# Patient Record
Sex: Female | Born: 1950 | Race: White | Hispanic: No | Smoking: Current every day smoker
Health system: Southern US, Community
[De-identification: ages and names within clinical notes are randomized; demographics above are authoritative.]

## PROBLEM LIST (undated history)

## (undated) DIAGNOSIS — F419 Anxiety disorder, unspecified: Secondary | ICD-10-CM

## (undated) DIAGNOSIS — J45909 Unspecified asthma, uncomplicated: Secondary | ICD-10-CM

## (undated) DIAGNOSIS — T7840XA Allergy, unspecified, initial encounter: Secondary | ICD-10-CM

## (undated) DIAGNOSIS — K219 Gastro-esophageal reflux disease without esophagitis: Secondary | ICD-10-CM

## (undated) DIAGNOSIS — J449 Chronic obstructive pulmonary disease, unspecified: Secondary | ICD-10-CM

## (undated) DIAGNOSIS — I10 Essential (primary) hypertension: Secondary | ICD-10-CM

## (undated) DIAGNOSIS — F32A Depression, unspecified: Secondary | ICD-10-CM

## (undated) DIAGNOSIS — F329 Major depressive disorder, single episode, unspecified: Secondary | ICD-10-CM

## (undated) DIAGNOSIS — M5134 Other intervertebral disc degeneration, thoracic region: Secondary | ICD-10-CM

## (undated) DIAGNOSIS — E785 Hyperlipidemia, unspecified: Secondary | ICD-10-CM

## (undated) DIAGNOSIS — R011 Cardiac murmur, unspecified: Secondary | ICD-10-CM

## (undated) HISTORY — PX: ABDOMINAL HYSTERECTOMY: SHX81

## (undated) HISTORY — DX: Unspecified asthma, uncomplicated: J45.909

## (undated) HISTORY — DX: Gastro-esophageal reflux disease without esophagitis: K21.9

## (undated) HISTORY — DX: Cardiac murmur, unspecified: R01.1

## (undated) HISTORY — DX: Hyperlipidemia, unspecified: E78.5

## (undated) HISTORY — DX: Essential (primary) hypertension: I10

## (undated) HISTORY — DX: Allergy, unspecified, initial encounter: T78.40XA

## (undated) HISTORY — DX: Depression, unspecified: F32.A

## (undated) HISTORY — PX: APPENDECTOMY: SHX54

## (undated) HISTORY — DX: Major depressive disorder, single episode, unspecified: F32.9

## (undated) HISTORY — DX: Other intervertebral disc degeneration, thoracic region: M51.34

## (undated) HISTORY — DX: Anxiety disorder, unspecified: F41.9

## (undated) HISTORY — PX: VASCULAR SURGERY: SHX849

---

## 2014-02-07 HISTORY — PX: FACIAL COSMETIC SURGERY: SHX629

## 2014-02-07 HISTORY — PX: OTHER SURGICAL HISTORY: SHX169

## 2014-02-07 HISTORY — PX: LAPAROSCOPY ABDOMEN DIAGNOSTIC: PRO50

## 2016-08-15 DIAGNOSIS — I2510 Atherosclerotic heart disease of native coronary artery without angina pectoris: Secondary | ICD-10-CM | POA: Diagnosis not present

## 2016-08-15 DIAGNOSIS — E785 Hyperlipidemia, unspecified: Secondary | ICD-10-CM | POA: Diagnosis not present

## 2016-08-15 DIAGNOSIS — I10 Essential (primary) hypertension: Secondary | ICD-10-CM | POA: Diagnosis not present

## 2016-08-15 DIAGNOSIS — G43909 Migraine, unspecified, not intractable, without status migrainosus: Secondary | ICD-10-CM | POA: Diagnosis not present

## 2016-08-22 DIAGNOSIS — I2510 Atherosclerotic heart disease of native coronary artery without angina pectoris: Secondary | ICD-10-CM | POA: Diagnosis not present

## 2016-08-22 DIAGNOSIS — E785 Hyperlipidemia, unspecified: Secondary | ICD-10-CM | POA: Diagnosis not present

## 2016-08-22 DIAGNOSIS — I10 Essential (primary) hypertension: Secondary | ICD-10-CM | POA: Diagnosis not present

## 2016-08-22 DIAGNOSIS — G43119 Migraine with aura, intractable, without status migrainosus: Secondary | ICD-10-CM | POA: Diagnosis not present

## 2016-08-30 DIAGNOSIS — I2510 Atherosclerotic heart disease of native coronary artery without angina pectoris: Secondary | ICD-10-CM | POA: Diagnosis not present

## 2016-08-30 DIAGNOSIS — E785 Hyperlipidemia, unspecified: Secondary | ICD-10-CM | POA: Diagnosis not present

## 2016-08-30 DIAGNOSIS — M169 Osteoarthritis of hip, unspecified: Secondary | ICD-10-CM | POA: Diagnosis not present

## 2016-08-30 DIAGNOSIS — I10 Essential (primary) hypertension: Secondary | ICD-10-CM | POA: Diagnosis not present

## 2016-09-13 ENCOUNTER — Ambulatory Visit
Admission: RE | Admit: 2016-09-13 | Discharge: 2016-09-13 | Disposition: A | Discharge status: Home / Self Care | Payer: Medicare HMO | Source: Ambulatory Visit | Attending: Internal Medicine | Admitting: Internal Medicine

## 2016-09-13 ENCOUNTER — Other Ambulatory Visit: Payer: Self-pay | Admitting: Internal Medicine

## 2016-09-13 DIAGNOSIS — M25552 Pain in left hip: Secondary | ICD-10-CM | POA: Diagnosis present

## 2016-09-13 DIAGNOSIS — M5137 Other intervertebral disc degeneration, lumbosacral region: Secondary | ICD-10-CM | POA: Insufficient documentation

## 2016-09-13 DIAGNOSIS — M160 Bilateral primary osteoarthritis of hip: Secondary | ICD-10-CM | POA: Insufficient documentation

## 2016-09-13 DIAGNOSIS — M25551 Pain in right hip: Secondary | ICD-10-CM | POA: Insufficient documentation

## 2016-09-13 DIAGNOSIS — M545 Low back pain: Secondary | ICD-10-CM | POA: Diagnosis not present

## 2016-09-13 DIAGNOSIS — M25852 Other specified joint disorders, left hip: Secondary | ICD-10-CM | POA: Diagnosis not present

## 2016-09-13 DIAGNOSIS — R52 Pain, unspecified: Secondary | ICD-10-CM

## 2016-09-13 DIAGNOSIS — M25851 Other specified joint disorders, right hip: Secondary | ICD-10-CM | POA: Diagnosis not present

## 2016-09-20 NOTE — Progress Notes (Signed)
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 appointment. The cutoff is your appointment time. This is why it is so important that you arrive at least 30 minutes before that appointment. If you have an appointment scheduled for 10:00 AM and you arrive at 10:01, you will be required to reschedule your appointment.  3. Plan ahead:  Always assume that you will encounter traffic on your way in. Plan for it. If you are dependent on a driver, make sure they understand these rules and the need to arrive early. 4. Other appointments and responsibilities:  Avoid scheduling any other appointments before or after your pain clinic appointments.  5. Be prepared:  Write down everything that you need to discuss with your healthcare provider and give this information to the admitting nurse. Write down the medications that you will need refilled. Bring your pills and bottles (even the empty ones), to all of your appointments, except for those where a procedure is scheduled. 6. No  children or pets:  Find someone to take care of them. It is not appropriate to bring them in. 7. Scheduling changes:  We request "advanced notification" of any changes or cancellations. 8. Advanced notification:  Defined as a time period of more than 24 hours prior to the originally scheduled appointment. This allows for the appointment to be offered to other patients. 9. Rescheduling:  When a visit is rescheduled, it will require the cancellation of the original appointment. For this reason they both fall within the  category of "Cancellations".  10. Cancellations:  They require advanced notification. Any cancellation less than 24 hours before the  appointment will be recorded as a "No Show". 11. No Show:  Defined as an unkept appointment where the patient failed to notify or declare to the practice their intention or inability to keep the appointment.  Corrective process for repeat offenders:  1. Tardiness: Three (3) episodes of rescheduling due to late arrivals will be recorded as one (1) "No Show". 2. Cancellation or reschedule: Three (3) cancellations or rescheduling will be recorded as one (1) "No Show". 3. "No Shows": Three (3) "No Shows" within a 12 month period will result in discharge from the practice.  ____________________________________________________________________________________________  ____________________________________________________________________________________________  Pain Scale  Introduction: The pain score used by this practice is the Verbal Numerical Rating Scale (VNRS-11). This is an 11-point scale. It is for adults and children 10 years or older. There are significant differences in how the pain score is reported, used, and applied. Forget everything you learned in the past and learn this scoring system.  General Information: The scale should reflect your current level of pain. Unless you are specifically asked for the level of your worst pain, or your  average pain. If you are asked for one of these two, then it should be understood that it is over the past 24 hours.  Basic Activities of Daily Living (ADL): Personal hygiene, dressing, eating, transferring, and using restroom.  Instructions: Most patients tend to report their level of pain as a combination of two factors, their physical pain and their psychosocial pain. This last one is also known as "suffering" and it is reflection of how physical pain affects you socially and psychologically. From now on, report them separately. From this point on, when asked to report your pain level, report only your physical pain. Use the following table for reference.  Pain Clinic Pain Levels (0-5/10)  Pain Level Score  Description  No Pain 0   Mild pain 1 Nagging, annoying, but does not interfere with basic activities of daily living (ADL). Patients are able to eat, bathe, get dressed, toileting (being able to get on and off the toilet and perform personal hygiene functions), transfer (move in and out of bed or a chair without assistance), and maintain continence (able to control bladder and bowel functions). Blood pressure and heart rate are unaffected. A normal heart rate for a healthy adult ranges from 60 to 100 bpm (beats per minute).   Mild to moderate pain 2 Noticeable and distracting. Impossible to hide from other people. More frequent flare-ups. Still possible to adapt and function close to normal. It can be very annoying and may have occasional stronger flare-ups. With discipline, patients may get used to it and adapt.   Moderate pain 3 Interferes significantly with activities of daily living (ADL). It becomes difficult to feed, bathe, get dressed, get on and off the toilet or to perform personal hygiene functions. Difficult to get in and out of bed or a chair without assistance. Very distracting. With effort, it can be ignored when deeply involved in activities.   Moderately severe pain 4 Impossible  to ignore for more than a few minutes. With effort, patients may still be able to manage work or participate in some social activities. Very difficult to concentrate. Signs of autonomic nervous system discharge are evident: dilated pupils (mydriasis); mild sweating (diaphoresis); sleep interference. Heart rate becomes elevated (>115 bpm). Diastolic blood pressure (lower number) rises above 100 mmHg. Patients  find relief in laying down and not moving.   Severe pain 5 Intense and extremely unpleasant. Associated with frowning face and frequent crying. Pain overwhelms the senses.  Ability to do any activity or maintain social relationships becomes significantly limited. Conversation becomes difficult. Pacing back and forth is common, as getting into a comfortable position is nearly impossible. Pain wakes you up from deep sleep. Physical signs will be obvious: pupillary dilation; increased sweating; goosebumps; brisk reflexes; cold, clammy hands and feet; nausea, vomiting or dry heaves; loss of appetite; significant sleep disturbance with inability to fall asleep or to remain asleep. When persistent, significant weight loss is observed due to the complete loss of appetite and sleep deprivation.  Blood pressure and heart rate becomes significantly elevated. Caution: If elevated blood pressure triggers a pounding headache associated with blurred vision, then the patient should immediately seek attention at an urgent or emergency care unit, as these may be signs of an impending stroke.    Emergency Department Pain Levels (6-10/10)  Emergency Room Pain 6 Severely limiting. Requires emergency care and should not be seen or managed at an outpatient pain management facility. Communication becomes difficult and requires great effort. Assistance to reach the emergency department may be required. Facial flushing and profuse sweating along with potentially dangerous increases in heart rate and blood pressure will be  evident.   Distressing pain 7 Self-care is very difficult. Assistance is required to transport, or use restroom. Assistance to reach the emergency department will be required. Tasks requiring coordination, such as bathing and getting dressed become very difficult.   Disabling pain 8 Self-care is no longer possible. At this level, pain is disabling. The individual is unable to do even the most "basic" activities such as walking, eating, bathing, dressing, transferring to a bed, or toileting. Fine motor skills are lost. It is difficult to think clearly.   Incapacitating pain 9 Pain becomes incapacitating. Thought processing is no longer possible. Difficult to remember your own name. Control of movement and coordination are lost.   The worst pain imaginable 10 At this level, most patients pass out from pain. When this level is reached, collapse of the autonomic nervous system occurs, leading to a sudden drop in blood pressure and heart rate. This in turn results in a temporary and dramatic drop in blood flow to the brain, leading to a loss of consciousness. Fainting is one of the body's self defense mechanisms. Passing out puts the brain in a calmed state and causes it to shut down for a while, in order to begin the healing process.    Summary: 1. Refer to this scale when providing [place] with your pain level. 2. Be accurate and careful when reporting your pain level. This will help with your care. 3. Over-reporting your pain level will lead to loss of credibility. 4. Even a level of 1/10 means that there is pain and will be treated at our facility. 5. High, inaccurate reporting will be documented as "Symptom Exaggeration", leading to loss of credibility and suspicions of possible secondary gains such as obtaining more narcotics, or wanting to appear disabled, for fraudulent reasons. 6. Only pain levels of 5 or below will be seen at our facility. 7. Pain levels of 6 and above will be sent to the Emergency  Department and the appointment cancelled. ____________________________________________________________________________________________  BMI Assessment: Estimated body mass index is 33.28 kg/m as calculated from the following:   Height as of this encounter: '5\' 5"'  (1.651 m).   Weight  as of this encounter: 200 lb (90.7 kg).  BMI interpretation table: BMI level Category Range association with higher incidence of chronic pain  <18 kg/m2 Underweight   18.5-24.9 kg/m2 Ideal body weight   25-29.9 kg/m2 Overweight Increased incidence by 20%  30-34.9 kg/m2 Obese (Class I) Increased incidence by 68%  35-39.9 kg/m2 Severe obesity (Class II) Increased incidence by 136%  >40 kg/m2 Extreme obesity (Class III) Increased incidence by 254%   BMI Readings from Last 4 Encounters:  09/21/16 33.28 kg/m   Wt Readings from Last 4 Encounters:  09/21/16 200 lb (90.7 kg)

## 2016-09-21 ENCOUNTER — Ambulatory Visit: Payer: Medicare HMO | Attending: Nurse Practitioner | Admitting: Nurse Practitioner

## 2016-09-21 ENCOUNTER — Other Ambulatory Visit: Payer: Self-pay | Admitting: Nurse Practitioner

## 2016-09-21 ENCOUNTER — Encounter: Payer: Self-pay | Admitting: Nurse Practitioner

## 2016-09-21 VITALS — BP 156/84 | HR 69 | Temp 98.5°F | Resp 16 | Ht 65.0 in | Wt 200.0 lb

## 2016-09-21 DIAGNOSIS — Z809 Family history of malignant neoplasm, unspecified: Secondary | ICD-10-CM | POA: Diagnosis not present

## 2016-09-21 DIAGNOSIS — Z90710 Acquired absence of both cervix and uterus: Secondary | ICD-10-CM | POA: Diagnosis not present

## 2016-09-21 DIAGNOSIS — Z8249 Family history of ischemic heart disease and other diseases of the circulatory system: Secondary | ICD-10-CM | POA: Insufficient documentation

## 2016-09-21 DIAGNOSIS — Z880 Allergy status to penicillin: Secondary | ICD-10-CM | POA: Insufficient documentation

## 2016-09-21 DIAGNOSIS — Z7982 Long term (current) use of aspirin: Secondary | ICD-10-CM | POA: Diagnosis not present

## 2016-09-21 DIAGNOSIS — M545 Low back pain: Secondary | ICD-10-CM

## 2016-09-21 DIAGNOSIS — E785 Hyperlipidemia, unspecified: Secondary | ICD-10-CM | POA: Insufficient documentation

## 2016-09-21 DIAGNOSIS — Z79891 Long term (current) use of opiate analgesic: Secondary | ICD-10-CM | POA: Diagnosis not present

## 2016-09-21 DIAGNOSIS — M542 Cervicalgia: Secondary | ICD-10-CM | POA: Diagnosis not present

## 2016-09-21 DIAGNOSIS — J45909 Unspecified asthma, uncomplicated: Secondary | ICD-10-CM | POA: Insufficient documentation

## 2016-09-21 DIAGNOSIS — F419 Anxiety disorder, unspecified: Secondary | ICD-10-CM | POA: Insufficient documentation

## 2016-09-21 DIAGNOSIS — G8929 Other chronic pain: Secondary | ICD-10-CM | POA: Insufficient documentation

## 2016-09-21 DIAGNOSIS — M533 Sacrococcygeal disorders, not elsewhere classified: Secondary | ICD-10-CM

## 2016-09-21 DIAGNOSIS — F329 Major depressive disorder, single episode, unspecified: Secondary | ICD-10-CM | POA: Diagnosis not present

## 2016-09-21 DIAGNOSIS — I10 Essential (primary) hypertension: Secondary | ICD-10-CM | POA: Insufficient documentation

## 2016-09-21 DIAGNOSIS — Z823 Family history of stroke: Secondary | ICD-10-CM | POA: Diagnosis not present

## 2016-09-21 DIAGNOSIS — M79601 Pain in right arm: Secondary | ICD-10-CM | POA: Insufficient documentation

## 2016-09-21 DIAGNOSIS — G894 Chronic pain syndrome: Secondary | ICD-10-CM

## 2016-09-21 DIAGNOSIS — R200 Anesthesia of skin: Secondary | ICD-10-CM | POA: Insufficient documentation

## 2016-09-21 DIAGNOSIS — M546 Pain in thoracic spine: Secondary | ICD-10-CM | POA: Diagnosis not present

## 2016-09-21 DIAGNOSIS — F17210 Nicotine dependence, cigarettes, uncomplicated: Secondary | ICD-10-CM | POA: Insufficient documentation

## 2016-09-21 DIAGNOSIS — R51 Headache: Secondary | ICD-10-CM | POA: Diagnosis not present

## 2016-09-21 DIAGNOSIS — M79602 Pain in left arm: Secondary | ICD-10-CM | POA: Insufficient documentation

## 2016-09-21 DIAGNOSIS — K219 Gastro-esophageal reflux disease without esophagitis: Secondary | ICD-10-CM | POA: Insufficient documentation

## 2016-09-21 DIAGNOSIS — M79605 Pain in left leg: Secondary | ICD-10-CM | POA: Diagnosis not present

## 2016-09-21 DIAGNOSIS — R531 Weakness: Secondary | ICD-10-CM | POA: Diagnosis not present

## 2016-09-21 DIAGNOSIS — Z9889 Other specified postprocedural states: Secondary | ICD-10-CM | POA: Insufficient documentation

## 2016-09-21 DIAGNOSIS — Z79899 Other long term (current) drug therapy: Secondary | ICD-10-CM | POA: Diagnosis not present

## 2016-09-21 DIAGNOSIS — M79604 Pain in right leg: Secondary | ICD-10-CM

## 2016-09-21 DIAGNOSIS — M5442 Lumbago with sciatica, left side: Secondary | ICD-10-CM

## 2016-09-21 DIAGNOSIS — M5441 Lumbago with sciatica, right side: Secondary | ICD-10-CM

## 2016-09-21 NOTE — Patient Instructions (Addendum)
____________________________________________________________________________________________  Appointment Policy Summary  It is our goal and responsibility to provide the medical community with assistance in the evaluation and management of patients with chronic pain. Unfortunately our resources are limited. Because we do not have an unlimited amount of time, or available appointments, we are required to closely monitor and manage their use. The following rules exist to maximize their use:  Patient's responsibilities: 1. Punctuality:  At what time should I arrive? You should be physically present in our office 30 minutes before your scheduled appointment. Your scheduled appointment is with your assigned healthcare provider. However, it takes 5-10 minutes to be "checked-in", and another 15 minutes for the nurses to do the admission. If you arrive to our office at the time you were given for your appointment, you will end up being at least 20-25 minutes late to your appointment with the provider. 2. Tardiness:  What happens if I arrive only a few minutes after my scheduled appointment time? You will need to reschedule your appointment. The cutoff is your appointment time. This is why it is so important that you arrive at least 30 minutes before that appointment. If you have an appointment scheduled for 10:00 AM and you arrive at 10:01, you will be required to reschedule your appointment.  3. Plan ahead:  Always assume that you will encounter traffic on your way in. Plan for it. If you are dependent on a driver, make sure they understand these rules and the need to arrive early. 4. Other appointments and responsibilities:  Avoid scheduling any other appointments before or after your pain clinic appointments.  5. Be prepared:  Write down everything that you need to discuss with your healthcare provider and give this information to the admitting nurse. Write down the medications that you will need  refilled. Bring your pills and bottles (even the empty ones), to all of your appointments, except for those where a procedure is scheduled. 6. No children or pets:  Find someone to take care of them. It is not appropriate to bring them in. 7. Scheduling changes:  We request "advanced notification" of any changes or cancellations. 8. Advanced notification:  Defined as a time period of more than 24 hours prior to the originally scheduled appointment. This allows for the appointment to be offered to other patients. 9. Rescheduling:  When a visit is rescheduled, it will require the cancellation of the original appointment. For this reason they both fall within the category of "Cancellations".  10. Cancellations:  They require advanced notification. Any cancellation less than 24 hours before the  appointment will be recorded as a "No Show". 11. No Show:  Defined as an unkept appointment where the patient failed to notify or declare to the practice their intention or inability to keep the appointment.  Corrective process for repeat offenders:  1. Tardiness: Three (3) episodes of rescheduling due to late arrivals will be recorded as one (1) "No Show". 2. Cancellation or reschedule: Three (3) cancellations or rescheduling will be recorded as one (1) "No Show". 3. "No Shows": Three (3) "No Shows" within a 12 month period will result in discharge from the practice.  ____________________________________________________________________________________________  ____________________________________________________________________________________________  Pain Scale  Introduction: The pain score used by this practice is the Verbal Numerical Rating Scale (VNRS-11). This is an 11-point scale. It is for adults and children 10 years or older. There are significant differences in how the pain score is reported, used, and applied. Forget everything you learned in the past and  learn this scoring  system.  General Information: The scale should reflect your current level of pain. Unless you are specifically asked for the level of your worst pain, or your average pain. If you are asked for one of these two, then it should be understood that it is over the past 24 hours.  Basic Activities of Daily Living (ADL): Personal hygiene, dressing, eating, transferring, and using restroom.  Instructions: Most patients tend to report their level of pain as a combination of two factors, their physical pain and their psychosocial pain. This last one is also known as "suffering" and it is reflection of how physical pain affects you socially and psychologically. From now on, report them separately. From this point on, when asked to report your pain level, report only your physical pain. Use the following table for reference.  Pain Clinic Pain Levels (0-5/10)  Pain Level Score  Description  No Pain 0   Mild pain 1 Nagging, annoying, but does not interfere with basic activities of daily living (ADL). Patients are able to eat, bathe, get dressed, toileting (being able to get on and off the toilet and perform personal hygiene functions), transfer (move in and out of bed or a chair without assistance), and maintain continence (able to control bladder and bowel functions). Blood pressure and heart rate are unaffected. A normal heart rate for a healthy adult ranges from 60 to 100 bpm (beats per minute).   Mild to moderate pain 2 Noticeable and distracting. Impossible to hide from other people. More frequent flare-ups. Still possible to adapt and function close to normal. It can be very annoying and may have occasional stronger flare-ups. With discipline, patients may get used to it and adapt.   Moderate pain 3 Interferes significantly with activities of daily living (ADL). It becomes difficult to feed, bathe, get dressed, get on and off the toilet or to perform personal hygiene functions. Difficult to get in and out of  bed or a chair without assistance. Very distracting. With effort, it can be ignored when deeply involved in activities.   Moderately severe pain 4 Impossible to ignore for more than a few minutes. With effort, patients may still be able to manage work or participate in some social activities. Very difficult to concentrate. Signs of autonomic nervous system discharge are evident: dilated pupils (mydriasis); mild sweating (diaphoresis); sleep interference. Heart rate becomes elevated (>115 bpm). Diastolic blood pressure (lower number) rises above 100 mmHg. Patients find relief in laying down and not moving.   Severe pain 5 Intense and extremely unpleasant. Associated with frowning face and frequent crying. Pain overwhelms the senses.  Ability to do any activity or maintain social relationships becomes significantly limited. Conversation becomes difficult. Pacing back and forth is common, as getting into a comfortable position is nearly impossible. Pain wakes you up from deep sleep. Physical signs will be obvious: pupillary dilation; increased sweating; goosebumps; brisk reflexes; cold, clammy hands and feet; nausea, vomiting or dry heaves; loss of appetite; significant sleep disturbance with inability to fall asleep or to remain asleep. When persistent, significant weight loss is observed due to the complete loss of appetite and sleep deprivation.  Blood pressure and heart rate becomes significantly elevated. Caution: If elevated blood pressure triggers a pounding headache associated with blurred vision, then the patient should immediately seek attention at an urgent or emergency care unit, as these may be signs of an impending stroke.    Emergency Department Pain Levels (6-10/10)  Emergency Room Pain 6   Severely limiting. Requires emergency care and should not be seen or managed at an outpatient pain management facility. Communication becomes difficult and requires great effort. Assistance to reach the  emergency department may be required. Facial flushing and profuse sweating along with potentially dangerous increases in heart rate and blood pressure will be evident.   Distressing pain 7 Self-care is very difficult. Assistance is required to transport, or use restroom. Assistance to reach the emergency department will be required. Tasks requiring coordination, such as bathing and getting dressed become very difficult.   Disabling pain 8 Self-care is no longer possible. At this level, pain is disabling. The individual is unable to do even the most "basic" activities such as walking, eating, bathing, dressing, transferring to a bed, or toileting. Fine motor skills are lost. It is difficult to think clearly.   Incapacitating pain 9 Pain becomes incapacitating. Thought processing is no longer possible. Difficult to remember your own name. Control of movement and coordination are lost.   The worst pain imaginable 10 At this level, most patients pass out from pain. When this level is reached, collapse of the autonomic nervous system occurs, leading to a sudden drop in blood pressure and heart rate. This in turn results in a temporary and dramatic drop in blood flow to the brain, leading to a loss of consciousness. Fainting is one of the body's self defense mechanisms. Passing out puts the brain in a calmed state and causes it to shut down for a while, in order to begin the healing process.    Summary: 1. Refer to this scale when providing us with your pain level. 2. Be accurate and careful when reporting your pain level. This will help with your care. 3. Over-reporting your pain level will lead to loss of credibility. 4. Even a level of 1/10 means that there is pain and will be treated at our facility. 5. High, inaccurate reporting will be documented as "Symptom Exaggeration", leading to loss of credibility and suspicions of possible secondary gains such as obtaining more narcotics, or wanting to appear  disabled, for fraudulent reasons. 6. Only pain levels of 5 or below will be seen at our facility. 7. Pain levels of 6 and above will be sent to the Emergency Department and the appointment cancelled. ____________________________________________________________________________________________  BMI Assessment: Estimated body mass index is 33.28 kg/m as calculated from the following:   Height as of this encounter: 5\' 5"  (1.651 m).   Weight as of this encounter: 200 lb (90.7 kg).  BMI interpretation table: BMI level Category Range association with higher incidence of chronic pain  <18 kg/m2 Underweight   18.5-24.9 kg/m2 Ideal body weight   25-29.9 kg/m2 Overweight Increased incidence by 20%  30-34.9 kg/m2 Obese (Class I) Increased incidence by 68%  35-39.9 kg/m2 Severe obesity (Class II) Increased incidence by 136%  >40 kg/m2 Extreme obesity (Class III) Increased incidence by 254%   BMI Readings from Last 4 Encounters:  09/21/16 33.28 kg/m   Wt Readings from Last 4 Encounters:  09/21/16 200 lb (90.7 kg)

## 2016-09-22 LAB — SEDIMENTATION RATE: Sed Rate: 10 mm/hr (ref 0–40)

## 2016-09-25 LAB — COMPLIANCE DRUG ANALYSIS, UR

## 2016-09-26 DIAGNOSIS — M169 Osteoarthritis of hip, unspecified: Secondary | ICD-10-CM | POA: Diagnosis not present

## 2016-09-26 DIAGNOSIS — G43909 Migraine, unspecified, not intractable, without status migrainosus: Secondary | ICD-10-CM | POA: Diagnosis not present

## 2016-09-26 DIAGNOSIS — E785 Hyperlipidemia, unspecified: Secondary | ICD-10-CM | POA: Diagnosis not present

## 2016-09-26 DIAGNOSIS — Z720 Tobacco use: Secondary | ICD-10-CM | POA: Diagnosis not present

## 2016-09-26 NOTE — Progress Notes (Signed)
BNZO-UNLISTED NOTE: This forensic urine drug screen (UDS) test was conducted using a state-of-the-art ultra high performance liquid chromatography and mass spectrometry system (UPLC/MS-MS), the most sophisticated and accurate method available. UPLC/MS-MS is 1,000 times more precise and accurate than standard gas chromatography and mass spectrometry (GC/MS). This system can analyze 26 drug categories and 180 drug compounds.  An unreported benzodiazepine was detected in the sample. CDC reports have identified the combination of opioids and benzodiazepines (Valium, Ativan, Xanax, Librium, Tranxene, Klonopin, Dalmane, Halcion,Restoril, etc.) to be associated in a significant number of reported drug-to-drug interactions leading to accidental overdosing leading to respiratory failure and death. 

## 2016-09-27 LAB — COMPREHENSIVE METABOLIC PANEL
A/G RATIO: 1.9 (ref 1.2–2.2)
ALT: 22 IU/L (ref 0–32)
AST: 21 IU/L (ref 0–40)
Albumin: 4.3 g/dL (ref 3.6–4.8)
Alkaline Phosphatase: 71 IU/L (ref 39–117)
BILIRUBIN TOTAL: 0.2 mg/dL (ref 0.0–1.2)
BUN/Creatinine Ratio: 15 (ref 12–28)
BUN: 13 mg/dL (ref 8–27)
CALCIUM: 9.5 mg/dL (ref 8.7–10.3)
CHLORIDE: 103 mmol/L (ref 96–106)
CO2: 24 mmol/L (ref 20–29)
Creatinine, Ser: 0.86 mg/dL (ref 0.57–1.00)
GFR, EST AFRICAN AMERICAN: 83 mL/min/{1.73_m2} (ref >59)
GFR, EST NON AFRICAN AMERICAN: 72 mL/min/{1.73_m2} (ref >59)
GLOBULIN, TOTAL: 2.3 g/dL (ref 1.5–4.5)
Glucose: 100 mg/dL — ABNORMAL HIGH (ref 65–99)
POTASSIUM: 4.1 mmol/L (ref 3.5–5.2)
SODIUM: 142 mmol/L (ref 134–144)
Total Protein: 6.6 g/dL (ref 6.0–8.5)

## 2016-09-27 LAB — C-REACTIVE PROTEIN: CRP: 6.3 mg/L — ABNORMAL HIGH (ref 0.0–4.9)

## 2016-09-27 LAB — 25-HYDROXYVITAMIN D LCMS D2+D3
25-HYDROXY, VITAMIN D-3: 39 ng/mL
25-HYDROXY, VITAMIN D: 39 ng/mL

## 2016-09-27 LAB — MAGNESIUM: MAGNESIUM: 2 mg/dL (ref 1.6–2.3)

## 2016-09-27 LAB — VITAMIN B12: Vitamin B-12: 713 pg/mL (ref 232–1245)

## 2016-09-30 ENCOUNTER — Encounter (INDEPENDENT_AMBULATORY_CARE_PROVIDER_SITE_OTHER): Payer: Medicare HMO | Admitting: Vascular Surgery

## 2016-10-19 ENCOUNTER — Ambulatory Visit
Admission: RE | Admit: 2016-10-19 | Discharge: 2016-10-19 | Disposition: A | Discharge status: Home / Self Care | Payer: Medicare HMO | Source: Ambulatory Visit | Attending: Nurse Practitioner | Admitting: Nurse Practitioner

## 2016-10-19 DIAGNOSIS — M546 Pain in thoracic spine: Secondary | ICD-10-CM | POA: Diagnosis not present

## 2016-10-19 DIAGNOSIS — G8929 Other chronic pain: Secondary | ICD-10-CM

## 2016-10-19 DIAGNOSIS — I700 Atherosclerosis of aorta: Secondary | ICD-10-CM | POA: Insufficient documentation

## 2016-10-19 DIAGNOSIS — M2578 Osteophyte, vertebrae: Secondary | ICD-10-CM | POA: Diagnosis not present

## 2016-10-19 DIAGNOSIS — M542 Cervicalgia: Secondary | ICD-10-CM | POA: Insufficient documentation

## 2016-10-19 DIAGNOSIS — M533 Sacrococcygeal disorders, not elsewhere classified: Secondary | ICD-10-CM | POA: Insufficient documentation

## 2016-10-19 DIAGNOSIS — M5134 Other intervertebral disc degeneration, thoracic region: Secondary | ICD-10-CM | POA: Diagnosis not present

## 2016-10-19 NOTE — Progress Notes (Signed)
Results were reviewed and found to be: abnormal  No acute injury or pathology identified  Review would suggest interventional pain management techniques may be of benefit

## 2016-10-19 NOTE — Progress Notes (Signed)
Results were reviewed and found to be: mildly abnormal  No acute injury or pathology identified  Review would suggest interventional pain management techniques may be of benefit 

## 2016-10-26 DIAGNOSIS — I739 Peripheral vascular disease, unspecified: Secondary | ICD-10-CM | POA: Diagnosis not present

## 2016-10-26 DIAGNOSIS — Z720 Tobacco use: Secondary | ICD-10-CM | POA: Diagnosis not present

## 2016-10-26 DIAGNOSIS — G43909 Migraine, unspecified, not intractable, without status migrainosus: Secondary | ICD-10-CM | POA: Diagnosis not present

## 2016-10-26 DIAGNOSIS — I10 Essential (primary) hypertension: Secondary | ICD-10-CM | POA: Diagnosis not present

## 2016-11-03 DIAGNOSIS — R69 Illness, unspecified: Secondary | ICD-10-CM | POA: Diagnosis not present

## 2016-11-06 DIAGNOSIS — Z79899 Other long term (current) drug therapy: Secondary | ICD-10-CM

## 2016-11-06 DIAGNOSIS — Z789 Other specified health status: Secondary | ICD-10-CM | POA: Insufficient documentation

## 2016-11-06 DIAGNOSIS — M899 Disorder of bone, unspecified: Secondary | ICD-10-CM | POA: Insufficient documentation

## 2016-11-06 DIAGNOSIS — F411 Generalized anxiety disorder: Secondary | ICD-10-CM | POA: Insufficient documentation

## 2016-11-06 HISTORY — DX: Other long term (current) drug therapy: Z79.899

## 2016-11-06 HISTORY — DX: Other specified health status: Z78.9

## 2016-11-06 NOTE — Progress Notes (Signed)
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 Chronic neck pain (Primary Area of Pain) (Bilateral)( (L>R). 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  Visit Diagnosis: 1. Chronic neck pain (Primary Area of Pain) (Bilateral)( (L>R)   2. Chronic upper extremity pain (Secondary Area of Pain) (Bilateral) (L>R)   3. Chronic low back pain [facility] Area of Pain) (Bilateral) (R>L)   4. Chronic thoracic back pain (Fourth Area of Pain) (Bilateral)  (R>L)   5. Chronic lower extremity pain (Bilateral)   6. Chronic pain syndrome   7. Chronic sacroiliac joint pain   8. Disorder of skeletal system   9. Pharmacologic therapy   10. Problems influencing health status   11. Lumbar facet syndrome (Bilateral) (R>L)    Problems updated and reviewed during this visit: Problem  Chronic upper extremity pain (Secondary Area of Pain) (Bilateral) (L>R)  Lumbar facet syndrome (Bilateral) (R>L)  Chronic low back pain (Tertiary Area of Pain) (Bilateral) (R>L)  Chronic thoracic back pain (Fourth Area of Pain) (Bilateral) (R>L)  Chronic lower extremity pain (Bilateral)  Chronic sacroiliac joint pain (Bilateral)    Plan of Care  Pharmacotherapy (Medications Ordered): Meds ordered this encounter  Medications  . traMADol (ULTRAM) 50 MG tablet    Sig: Take 1 tablet (50 mg total) by mouth every 6 (six) hours as needed for severe pain.    Dispense:  120 tablet    Refill:  0    Do not place this medication, or any other prescription from our practice, on "Automatic Refill". Patient may have prescription filled one day early if pharmacy is closed on scheduled refill date. Do not fill until: 11/07/16 To last until: 12/07/16    Procedure Orders     LUMBAR FACET(MEDIAL BRANCH NERVE BLOCK) MBNB     SACROILIAC JOINT INJECTINS Lab Orders  No laboratory test(s) ordered today   Imaging Orders  No imaging studies ordered today   Referral Orders  No referral(s) requested today    Pharmacological management options:  Opioid Analgesics: We'll take over management today. See above orders Membrane stabilizer: We have discussed the possibility of optimizing this mode of therapy, if tolerated Muscle relaxant: We have discussed the possibility  of a trial NSAID: We have discussed the possibility of a trial Other analgesic(s): To be determined at a later time   Interventional management options: Planned, scheduled, and/or pending:    Diagnostic bilateral  lumbar facet block + bilateral sacroiliac joint block under fluoroscopic guidance and IV sedation.   Considering:   Diagnostic bilateral cervical epidural steroid injection Diagnostic bilateral cervical facet block Possible bilateral cervical radiofrequency ablation Diagnostic bilateral lumbar epidural steroid injection Diagnostic bilateral lumbar facet block Possible bilateral lumbar radiofrequency Diagnostic sacroiliac joint injection    PRN Procedures:   None at this time   Provider-requested follow-up: Return for Procedure (w/ sedation): (B) L-FCT + SI Blk.  No future appointments.  Primary Care Physician: Cletis Athens, MD Location: [facility] Note by: Gaspar Cola, MD Date: 11/07/2016; Time: 2:34 PM

## 2016-11-07 ENCOUNTER — Ambulatory Visit: Payer: Medicare HMO | Attending: Pain Medicine | Admitting: Pain Medicine

## 2016-11-07 ENCOUNTER — Encounter: Payer: Self-pay | Admitting: Pain Medicine

## 2016-11-07 VITALS — BP 140/77 | HR 75 | Temp 98.4°F | Resp 16 | Ht 65.0 in | Wt 191.0 lb

## 2016-11-07 DIAGNOSIS — M533 Sacrococcygeal disorders, not elsewhere classified: Secondary | ICD-10-CM | POA: Diagnosis not present

## 2016-11-07 DIAGNOSIS — R531 Weakness: Secondary | ICD-10-CM | POA: Diagnosis not present

## 2016-11-07 DIAGNOSIS — Z7982 Long term (current) use of aspirin: Secondary | ICD-10-CM | POA: Insufficient documentation

## 2016-11-07 DIAGNOSIS — R200 Anesthesia of skin: Secondary | ICD-10-CM | POA: Insufficient documentation

## 2016-11-07 DIAGNOSIS — Z789 Other specified health status: Secondary | ICD-10-CM | POA: Diagnosis not present

## 2016-11-07 DIAGNOSIS — M546 Pain in thoracic spine: Secondary | ICD-10-CM | POA: Insufficient documentation

## 2016-11-07 DIAGNOSIS — F17210 Nicotine dependence, cigarettes, uncomplicated: Secondary | ICD-10-CM | POA: Insufficient documentation

## 2016-11-07 DIAGNOSIS — R51 Headache: Secondary | ICD-10-CM | POA: Diagnosis not present

## 2016-11-07 DIAGNOSIS — M47816 Spondylosis without myelopathy or radiculopathy, lumbar region: Secondary | ICD-10-CM | POA: Insufficient documentation

## 2016-11-07 DIAGNOSIS — M542 Cervicalgia: Secondary | ICD-10-CM | POA: Insufficient documentation

## 2016-11-07 DIAGNOSIS — M545 Low back pain: Secondary | ICD-10-CM | POA: Diagnosis not present

## 2016-11-07 DIAGNOSIS — Z79899 Other long term (current) drug therapy: Secondary | ICD-10-CM

## 2016-11-07 DIAGNOSIS — G8929 Other chronic pain: Secondary | ICD-10-CM

## 2016-11-07 DIAGNOSIS — M5441 Lumbago with sciatica, right side: Secondary | ICD-10-CM

## 2016-11-07 DIAGNOSIS — Z880 Allergy status to penicillin: Secondary | ICD-10-CM | POA: Diagnosis not present

## 2016-11-07 DIAGNOSIS — M25552 Pain in left hip: Secondary | ICD-10-CM | POA: Insufficient documentation

## 2016-11-07 DIAGNOSIS — G894 Chronic pain syndrome: Secondary | ICD-10-CM | POA: Insufficient documentation

## 2016-11-07 DIAGNOSIS — M899 Disorder of bone, unspecified: Secondary | ICD-10-CM

## 2016-11-07 DIAGNOSIS — M79602 Pain in left arm: Secondary | ICD-10-CM | POA: Insufficient documentation

## 2016-11-07 DIAGNOSIS — M160 Bilateral primary osteoarthritis of hip: Secondary | ICD-10-CM | POA: Diagnosis not present

## 2016-11-07 DIAGNOSIS — M5442 Lumbago with sciatica, left side: Secondary | ICD-10-CM | POA: Diagnosis not present

## 2016-11-07 DIAGNOSIS — M5137 Other intervertebral disc degeneration, lumbosacral region: Secondary | ICD-10-CM | POA: Insufficient documentation

## 2016-11-07 DIAGNOSIS — M25551 Pain in right hip: Secondary | ICD-10-CM | POA: Insufficient documentation

## 2016-11-07 DIAGNOSIS — F411 Generalized anxiety disorder: Secondary | ICD-10-CM | POA: Insufficient documentation

## 2016-11-07 DIAGNOSIS — M79604 Pain in right leg: Secondary | ICD-10-CM | POA: Insufficient documentation

## 2016-11-07 DIAGNOSIS — R69 Illness, unspecified: Secondary | ICD-10-CM | POA: Diagnosis not present

## 2016-11-07 DIAGNOSIS — M79601 Pain in right arm: Secondary | ICD-10-CM | POA: Diagnosis not present

## 2016-11-07 DIAGNOSIS — M79605 Pain in left leg: Secondary | ICD-10-CM

## 2016-11-07 MED ORDER — TRAMADOL HCL 50 MG PO TABS: 50.0000 mg | ORAL_TABLET | Freq: Four times a day (QID) | ORAL | 0 refills | Status: DC | PRN

## 2016-11-07 NOTE — Patient Instructions (Signed)
____________________________________________________________________________________________  Preparing for Procedure with Sedation Instructions: . Oral Intake: Do not eat or drink anything for at least 8 hours prior to your procedure. . Transportation: Public transportation is not allowed. Bring an adult driver. The driver must be physically present in our waiting room before any procedure can be started. Marland Kitchen Physical Assistance: Bring an adult physically capable of assisting you, in the event you need help. This adult should keep you company at home for at least 6 hours after the procedure. . Blood Pressure Medicine: Take your blood pressure medicine with a sip of water the morning of the procedure. . Blood thinners:  . Diabetics on insulin: Notify the staff so that you can be scheduled 1st case in the morning. If your diabetes requires high dose insulin, take only  of your normal insulin dose the morning of the procedure and notify the staff that you have done so. . Preventing infections: Shower with an antibacterial soap the morning of your procedure. . Build-up your immune system: Take 1000 mg of Vitamin C with every meal (3 times a day) the day prior to your procedure. Marland Kitchen Antibiotics: Inform the staff if you have a condition or reason that requires you to take antibiotics before dental procedures. . Pregnancy: If you are pregnant, call and cancel the procedure. . Sickness: If you have a cold, fever, or any active infections, call and cancel the procedure. . Arrival: You must be in the facility at least 30 minutes prior to your scheduled procedure. . Children: Do not bring children with you. . Dress appropriately: Bring dark clothing that you would not mind if they get stained. . Valuables: Do not bring any jewelry or valuables. Procedure appointments are reserved for interventional treatments only. Marland Kitchen No Prescription Refills. . No medication changes will be discussed during procedure  appointments. . No disability issues will be discussed. ____________________________________________________________________________________________  ____________________________________________________________________________________________  Pain Scale  Introduction: The pain score used by this practice is the Verbal Numerical Rating Scale (VNRS-11). This is an 11-point scale. It is for adults and children 10 years or older. There are significant differences in how the pain score is reported, used, and applied. Forget everything you learned in the past and learn this scoring system.  General Information: The scale should reflect your current level of pain. Unless you are specifically asked for the level of your worst pain, or your average pain. If you are asked for one of these two, then it should be understood that it is over the past 24 hours.  Basic Activities of Daily Living (ADL): Personal hygiene, dressing, eating, transferring, and using restroom.  Instructions: Most patients tend to report their level of pain as a combination of two factors, their physical pain and their psychosocial pain. This last one is also known as "suffering" and it is reflection of how physical pain affects you socially and psychologically. From now on, report them separately. From this point on, when asked to report your pain level, report only your physical pain. Use the following table for reference.  Pain Clinic Pain Levels (0-5/10)  Pain Level Score  Description  No Pain 0   Mild pain 1 Nagging, annoying, but does not interfere with basic activities of daily living (ADL). Patients are able to eat, bathe, get dressed, toileting (being able to get on and off the toilet and perform personal hygiene functions), transfer (move in and out of bed or a chair without assistance), and maintain continence (able to control bladder and bowel  functions). Blood pressure and heart rate are unaffected. A normal heart rate for  a healthy adult ranges from 60 to 100 bpm (beats per minute).   Mild to moderate pain 2 Noticeable and distracting. Impossible to hide from other people. More frequent flare-ups. Still possible to adapt and function close to normal. It can be very annoying and may have occasional stronger flare-ups. With discipline, patients may get used to it and adapt.   Moderate pain 3 Interferes significantly with activities of daily living (ADL). It becomes difficult to feed, bathe, get dressed, get on and off the toilet or to perform personal hygiene functions. Difficult to get in and out of bed or a chair without assistance. Very distracting. With effort, it can be ignored when deeply involved in activities.   Moderately severe pain 4 Impossible to ignore for more than a few minutes. With effort, patients may still be able to manage work or participate in some social activities. Very difficult to concentrate. Signs of autonomic nervous system discharge are evident: dilated pupils (mydriasis); mild sweating (diaphoresis); sleep interference. Heart rate becomes elevated (>115 bpm). Diastolic blood pressure (lower number) rises above 100 mmHg. Patients find relief in laying down and not moving.   Severe pain 5 Intense and extremely unpleasant. Associated with frowning face and frequent crying. Pain overwhelms the senses.  Ability to do any activity or maintain social relationships becomes significantly limited. Conversation becomes difficult. Pacing back and forth is common, as getting into a comfortable position is nearly impossible. Pain wakes you up from deep sleep. Physical signs will be obvious: pupillary dilation; increased sweating; goosebumps; brisk reflexes; cold, clammy hands and feet; nausea, vomiting or dry heaves; loss of appetite; significant sleep disturbance with inability to fall asleep or to remain asleep. When persistent, significant weight loss is observed due to the complete loss of appetite and  sleep deprivation.  Blood pressure and heart rate becomes significantly elevated. Caution: If elevated blood pressure triggers a pounding headache associated with blurred vision, then the patient should immediately seek attention at an urgent or emergency care unit, as these may be signs of an impending stroke.    Emergency Department Pain Levels (6-10/10)  Emergency Room Pain 6 Severely limiting. Requires emergency care and should not be seen or managed at an outpatient pain management facility. Communication becomes difficult and requires great effort. Assistance to reach the emergency department may be required. Facial flushing and profuse sweating along with potentially dangerous increases in heart rate and blood pressure will be evident.   Distressing pain 7 Self-care is very difficult. Assistance is required to transport, or use restroom. Assistance to reach the emergency department will be required. Tasks requiring coordination, such as bathing and getting dressed become very difficult.   Disabling pain 8 Self-care is no longer possible. At this level, pain is disabling. The individual is unable to do even the most "basic" activities such as walking, eating, bathing, dressing, transferring to a bed, or toileting. Fine motor skills are lost. It is difficult to think clearly.   Incapacitating pain 9 Pain becomes incapacitating. Thought processing is no longer possible. Difficult to remember your own name. Control of movement and coordination are lost.   The worst pain imaginable 10 At this level, most patients pass out from pain. When this level is reached, collapse of the autonomic nervous system occurs, leading to a sudden drop in blood pressure and heart rate. This in turn results in a temporary and dramatic drop in blood flow  to the brain, leading to a loss of consciousness. Fainting is one of the body's self defense mechanisms. Passing out puts the brain in a calmed state and causes it to shut  down for a while, in order to begin the healing process.    Summary: 1. Refer to this scale when providing [place] with your pain level. 2. Be accurate and careful when reporting your pain level. This will help with your care. 3. Over-reporting your pain level will lead to loss of credibility. 4. Even a level of 1/10 means that there is pain and will be treated at our facility. 5. High, inaccurate reporting will be documented as "Symptom Exaggeration", leading to loss of credibility and suspicions of possible secondary gains such as obtaining more narcotics, or wanting to appear disabled, for fraudulent reasons. 6. Only pain levels of 5 or below will be seen at our facility. 7. Pain levels of 6 and above will be sent to the Emergency Department and the appointment cancelled. ____________________________________________________________________________________________   ____________________________________________________________________________________________  Medication Rules  Applies to: All patients receiving prescriptions (written or electronic).  Pharmacy of record: Pharmacy where electronic prescriptions will be sent. If written prescriptions are taken to a different pharmacy, please inform the nursing staff. The pharmacy listed in the electronic medical record should be the one where you would like electronic prescriptions to be sent.  Prescription refills: Only during scheduled appointments. Applies to both, written and electronic prescriptions.  NOTE: The following applies primarily to controlled substances (Opioid* Pain Medications).   Patient's responsibilities: 1. Pain Pills: Bring all pain pills to every appointment (except for procedure appointments). 2. Pill Bottles: Bring pills in original pharmacy bottle. Always bring newest bottle. Bring bottle, even if empty. 3. Medication refills: You are responsible for knowing and keeping track of what medications you need refilled. The  day before your appointment, write a list of all prescriptions that need to be refilled. Bring that list to your appointment and give it to the admitting nurse. Prescriptions will be written only during appointments. If you forget a medication, it will not be "Called in", "Faxed", or "electronically sent". You will need to get another appointment to get these prescribed. 4. Prescription Accuracy: You are responsible for carefully inspecting your prescriptions before leaving our office. Have the discharge nurse carefully go over each prescription with you, before taking them home. Make sure that your name is accurately spelled, that your address is correct. Check the name and dose of your medication to make sure it is accurate. Check the number of pills, and the written instructions to make sure they are clear and accurate. Make sure that you are given enough medication to last until your next medication refill appointment. 5. Taking Medication: Take medication as prescribed. Never take more pills than instructed. Never take medication more frequently than prescribed. Taking less pills or less frequently is permitted and encouraged, when it comes to controlled substances (written prescriptions).  6. Inform other Doctors: Always inform, all of your healthcare providers, of all the medications you take. 7. Pain Medication from other Providers: You are not allowed to accept any additional pain medication from any other Doctor or Healthcare provider. There are two exceptions to this rule. (see below) In the event that you require additional pain medication, you are responsible for notifying us, as stated below. 8. Medication Agreement: You are responsible for carefully reading and following our Medication Agreement. This must be signed before receiving any prescriptions from our practice. Safely store a  copy of your signed Agreement. Violations to the Agreement will result in no further prescriptions. (Additional  copies of our Medication Agreement are available upon request.) 9. Laws, Rules, & Regulations: All patients are expected to follow all [address] and [employer], Rules, [employer]. Ignorance of the Laws does not constitute a valid excuse. The use of any illegal substances is prohibited. 10. Adopted CDC guidelines & recommendations: Target dosing levels will be at or below 60 MME/day. Use of benzodiazepines** is not recommended.  Exceptions: There are only two exceptions to the rule of not receiving pain medications from other Healthcare Providers. 1. Exception #1 (Emergencies): In the event of an emergency (i.e.: accident requiring emergency care), you are allowed to receive additional pain medication. However, you are responsible for: As soon as you are able, call our office [phone], at any time of the day or night, and leave a message stating your name, the date and nature of the emergency, and the name and dose of the medication prescribed. In the event that your call is answered by a member of our staff, make sure to document and save the date, time, and the name of the person that took your information.  2. Exception #2 (Planned Surgery): In the event that you are scheduled by another doctor or dentist to have any type of surgery or procedure, you are allowed (for a period no longer than 30 days), to receive additional pain medication, for the acute post-op pain. However, in this case, you are responsible for picking up a copy of our "Post-op Pain Management for Surgeons" handout, and giving it to your surgeon or dentist. This document is available at our office, and does not require an appointment to obtain it. Simply go to our office during business hours (Monday-Thursday from 8:00 AM to 4:00 PM) (Friday 8:00 AM to 12:00 Noon) or if you have a scheduled appointment with [place], prior to your surgery, and ask for it by name. In addition, you will need to provide [place] with your name, name of  your surgeon, type of surgery, and date of procedure or surgery.  *Opioid medications include: morphine, codeine, oxycodone, oxymorphone, hydrocodone, hydromorphone, meperidine, tramadol, tapentadol, buprenorphine, fentanyl, methadone. **Benzodiazepine medications include: diazepam (Valium), alprazolam (Xanax), clonazepam (Klonopine), lorazepam (Ativan), clorazepate (Tranxene), chlordiazepoxide (Librium), estazolam (Prosom), oxazepam (Serax), temazepam (Restoril), triazolam (Halcion)  ____________________________________________________________________________________________

## 2016-11-07 NOTE — Progress Notes (Signed)
Safety precautions to be maintained throughout the outpatient stay will include: orient to surroundings, keep bed in low position, maintain call bell within reach at all times, provide assistance with transfer out of bed and ambulation.  

## 2016-11-15 DIAGNOSIS — E785 Hyperlipidemia, unspecified: Secondary | ICD-10-CM | POA: Diagnosis not present

## 2016-11-15 DIAGNOSIS — I2510 Atherosclerotic heart disease of native coronary artery without angina pectoris: Secondary | ICD-10-CM | POA: Diagnosis not present

## 2016-11-15 DIAGNOSIS — G43909 Migraine, unspecified, not intractable, without status migrainosus: Secondary | ICD-10-CM | POA: Diagnosis not present

## 2016-11-15 DIAGNOSIS — Z720 Tobacco use: Secondary | ICD-10-CM | POA: Diagnosis not present

## 2016-11-17 ENCOUNTER — Ambulatory Visit
Admission: RE | Admit: 2016-11-17 | Discharge: 2016-11-17 | Disposition: A | Discharge status: Home / Self Care | Payer: Medicare HMO | Source: Ambulatory Visit | Attending: Pain Medicine | Admitting: Pain Medicine

## 2016-11-17 ENCOUNTER — Encounter: Payer: Self-pay | Admitting: Pain Medicine

## 2016-11-17 ENCOUNTER — Ambulatory Visit (HOSPITAL_BASED_OUTPATIENT_CLINIC_OR_DEPARTMENT_OTHER): Payer: Medicare HMO | Admitting: Pain Medicine

## 2016-11-17 VITALS — BP 156/82 | HR 73 | Temp 96.9°F | Resp 18 | Ht 65.0 in | Wt 190.0 lb

## 2016-11-17 DIAGNOSIS — M5442 Lumbago with sciatica, left side: Secondary | ICD-10-CM | POA: Diagnosis not present

## 2016-11-17 DIAGNOSIS — M533 Sacrococcygeal disorders, not elsewhere classified: Secondary | ICD-10-CM | POA: Insufficient documentation

## 2016-11-17 DIAGNOSIS — M47816 Spondylosis without myelopathy or radiculopathy, lumbar region: Secondary | ICD-10-CM | POA: Diagnosis not present

## 2016-11-17 DIAGNOSIS — M5134 Other intervertebral disc degeneration, thoracic region: Secondary | ICD-10-CM

## 2016-11-17 DIAGNOSIS — G8929 Other chronic pain: Secondary | ICD-10-CM | POA: Insufficient documentation

## 2016-11-17 DIAGNOSIS — Z91013 Allergy to seafood: Secondary | ICD-10-CM | POA: Insufficient documentation

## 2016-11-17 DIAGNOSIS — F1190 Opioid use, unspecified, uncomplicated: Secondary | ICD-10-CM | POA: Insufficient documentation

## 2016-11-17 DIAGNOSIS — M5030 Other cervical disc degeneration, unspecified cervical region: Secondary | ICD-10-CM | POA: Insufficient documentation

## 2016-11-17 DIAGNOSIS — M5441 Lumbago with sciatica, right side: Secondary | ICD-10-CM | POA: Insufficient documentation

## 2016-11-17 DIAGNOSIS — M47818 Spondylosis without myelopathy or radiculopathy, sacral and sacrococcygeal region: Secondary | ICD-10-CM

## 2016-11-17 DIAGNOSIS — M160 Bilateral primary osteoarthritis of hip: Secondary | ICD-10-CM | POA: Insufficient documentation

## 2016-11-17 DIAGNOSIS — Z880 Allergy status to penicillin: Secondary | ICD-10-CM | POA: Diagnosis not present

## 2016-11-17 DIAGNOSIS — M47812 Spondylosis without myelopathy or radiculopathy, cervical region: Secondary | ICD-10-CM | POA: Insufficient documentation

## 2016-11-17 DIAGNOSIS — Z79899 Other long term (current) drug therapy: Secondary | ICD-10-CM | POA: Insufficient documentation

## 2016-11-17 DIAGNOSIS — Z79891 Long term (current) use of opiate analgesic: Secondary | ICD-10-CM | POA: Insufficient documentation

## 2016-11-17 DIAGNOSIS — M5136 Other intervertebral disc degeneration, lumbar region: Secondary | ICD-10-CM | POA: Insufficient documentation

## 2016-11-17 HISTORY — DX: Other intervertebral disc degeneration, thoracic region: M51.34

## 2016-11-17 MED ORDER — ROPIVACAINE HCL 2 MG/ML IJ SOLN
9.0000 mL | Freq: Once | INTRAMUSCULAR | Status: AC
  Administered 2016-11-17: 10 mL via PERINEURAL
  Filled 2016-11-17: qty 10

## 2016-11-17 MED ORDER — TRIAMCINOLONE ACETONIDE 40 MG/ML IJ SUSP
40.0000 mg | Freq: Once | INTRAMUSCULAR | Status: AC
  Administered 2016-11-17: 40 mg
  Filled 2016-11-17: qty 1

## 2016-11-17 MED ORDER — TRIAMCINOLONE ACETONIDE 40 MG/ML IJ SUSP
INTRAMUSCULAR | Status: AC
  Filled 2016-11-17: qty 1

## 2016-11-17 MED ORDER — LACTATED RINGERS IV SOLN
1000.0000 mL | Freq: Once | INTRAVENOUS | Status: AC
  Administered 2016-11-17: 1000 mL via INTRAVENOUS

## 2016-11-17 MED ORDER — ROPIVACAINE HCL 2 MG/ML IJ SOLN
9.0000 mL | Freq: Once | INTRAMUSCULAR | Status: AC
  Administered 2016-11-17: 10 mL via INTRA_ARTICULAR
  Filled 2016-11-17: qty 10

## 2016-11-17 MED ORDER — METHYLPREDNISOLONE ACETATE 80 MG/ML IJ SUSP
80.0000 mg | Freq: Once | INTRAMUSCULAR | Status: AC
Start: 2016-11-17 — End: 2016-11-17
  Administered 2016-11-17: 80 mg via INTRA_ARTICULAR
  Filled 2016-11-17: qty 1

## 2016-11-17 MED ORDER — LIDOCAINE HCL 2 % IJ SOLN
10.0000 mL | Freq: Once | INTRAMUSCULAR | Status: AC
  Administered 2016-11-17: 400 mg
  Filled 2016-11-17: qty 20

## 2016-11-17 MED ORDER — MIDAZOLAM HCL 5 MG/5ML IJ SOLN
1.0000 mg | INTRAMUSCULAR | Status: DC | PRN
  Filled 2016-11-17: qty 5

## 2016-11-17 MED ORDER — FENTANYL CITRATE (PF) 100 MCG/2ML IJ SOLN
25.0000 ug | INTRAMUSCULAR | Status: DC | PRN
Start: 2016-11-17 — End: 2016-11-17
  Administered 2016-11-17: 100 ug via INTRAVENOUS
  Filled 2016-11-17: qty 2

## 2016-11-17 MED ORDER — ROPIVACAINE HCL 2 MG/ML IJ SOLN
9.0000 mL | Freq: Once | INTRAMUSCULAR | Status: AC
Start: 2016-11-17 — End: 2016-11-17
  Administered 2016-11-17: 10 mL via PERINEURAL
  Filled 2016-11-17: qty 10

## 2016-11-17 NOTE — Patient Instructions (Signed)

## 2016-11-17 NOTE — Progress Notes (Signed)
Patient's Name: Sabrina Barker  MRN: [id number]  Referring Provider: Delano Metz, MD  DOB: Feb 15, 1950  PCP: Corky Downs, MD  DOS: 11/17/2016  Note by: Oswaldo Done, MD  Service setting: Ambulatory outpatient  Specialty: Interventional Pain Management  Patient type: Established  Location: [facility] (AMB) Pain Management Facility  Visit type: Interventional Procedure   Primary Reason for Visit: Interventional Pain Management Treatment. CC: Back Pain (lower right is worse)  Procedure:  Anesthesia, Analgesia, Anxiolysis:  Procedure #1: Type: Diagnostic Medial Branch Facet Block Region: Lumbar Level: L2, L3, L4, L5, & S1 Medial Branch Level(s) Laterality: Bilateral  Procedure #2: Type: Diagnostic Sacroiliac Joint Block Region: Posterior Lumbosacral Level: PSIS (Posterior Superior Iliac Spine) Sacroiliac Joint Laterality: Bilateral  Type: Local Anesthesia with Moderate (Conscious) Sedation Local Anesthetic: Lidocaine 1% Route: Intravenous (IV) IV Access: Secured Sedation: Meaningful verbal contact was maintained at all times during the procedure  Indication(s): Analgesia and Anxiety   Indications: 1. Lumbar facet syndrome (Bilateral) (R>L)   2. Chronic sacroiliac joint pain (Bilateral)   3. Lumbar facet osteoarthritis   4. Chronic sacroiliac joint arthropathy (Bilateral)   5. Chronic low back pain [facility]) (Bilateral) (R>L)   6. Chronic sacroiliac joint pain    Pain Score: Pre-procedure: 2 /10 Post-procedure: 0-No pain/10  Pre-op Assessment:  Sabrina Barker is a 66 y.o. (year old), female patient, seen today for interventional treatment. She  has a past surgical history that includes Abdominal hysterectomy; Appendectomy; tummy tuck (2016); rhinoplasty (2016); Facial cosmetic surgery (2016); and Laparoscopy abdomen diagnostic (2016). Sabrina Barker has a current medication list which includes the following prescription(s): aspirin ec, cimetidine, diphenhydramine,  gabapentin, ibuprofen, losartan, metoprolol tartrate, rosuvastatin, sertraline, tramadol, trazodone, and ventolin hfa, and the following Facility-Administered Medications: fentanyl and midazolam. Her primarily concern today is the Back Pain (lower right is worse)  Initial Vital Signs: There were no vitals taken for this visit. BMI: Estimated body mass index is 31.62 kg/m as calculated from the following:   Height as of this encounter:  (1.651 m).   Weight as of this encounter: 190 lb (86.2 kg).  Risk Assessment: Allergies: Reviewed. She is allergic to penicillins and shellfish allergy.  Allergy Precautions: None required Coagulopathies: Reviewed. None identified.  Blood-thinner therapy: None at this time Active Infection(s): Reviewed. None identified. Sabrina Barker is afebrile  Site Confirmation: Sabrina Barker was asked to confirm the procedure and laterality before marking the site Procedure checklist: Completed Consent: Before the procedure and under the influence of no sedative(s), amnesic(s), or anxiolytics, the patient was informed of the treatment options, risks and possible complications. To fulfill our ethical and legal obligations, as recommended by the American Medical Association's Code of Ethics, I have informed the patient of my clinical impression; the nature and purpose of the treatment or procedure; the risks, benefits, and possible complications of the intervention; the alternatives, including doing nothing; the risk(s) and benefit(s) of the alternative treatment(s) or procedure(s); and the risk(s) and benefit(s) of doing nothing. The patient was provided information about the general risks and possible complications associated with the procedure. These may include, but are not limited to: failure to achieve desired goals, infection, bleeding, organ or nerve damage, allergic reactions, paralysis, and death. In addition, the patient was informed of those risks and complications  associated to Spine-related procedures, such as failure to decrease pain; infection (i.e.: Meningitis, epidural or intraspinal abscess); bleeding (i.e.: epidural hematoma, subarachnoid hemorrhage, or any other type of intraspinal or peri-dural bleeding); organ or nerve  damage (i.e.: Any type of peripheral nerve, nerve root, or spinal cord injury) with subsequent damage to sensory, motor, and/or autonomic systems, resulting in permanent pain, numbness, and/or weakness of one or several areas of the body; allergic reactions; (i.e.: anaphylactic reaction); and/or death. Furthermore, the patient was informed of those risks and complications associated with the medications. These include, but are not limited to: allergic reactions (i.e.: anaphylactic or anaphylactoid reaction(s)); adrenal axis suppression; blood sugar elevation that in diabetics may result in ketoacidosis or comma; water retention that in patients with history of congestive heart failure may result in shortness of breath, pulmonary edema, and decompensation with resultant heart failure; weight gain; swelling or edema; medication-induced neural toxicity; particulate matter embolism and blood vessel occlusion with resultant organ, and/or nervous system infarction; and/or aseptic necrosis of one or more joints. Finally, the patient was informed that Medicine is not an exact science; therefore, there is also the possibility of unforeseen or unpredictable risks and/or possible complications that may result in a catastrophic outcome. The patient indicated having understood very clearly. We have given the patient no guarantees and we have made no promises. Enough time was given to the patient to ask questions, all of which were answered to the patient's satisfaction. Sabrina Barker has indicated that she wanted to continue with the procedure. Attestation: I, the ordering provider, attest that I have discussed with the patient the benefits, risks, side-effects,  alternatives, likelihood of achieving goals, and potential problems during recovery for the procedure that I have provided informed consent. Date: 11/17/2016; Time: 7:23 AM  Pre-Procedure Preparation:  Monitoring: As per clinic protocol. Respiration, ETCO2, SpO2, BP, heart rate and rhythm monitor placed and checked for adequate function Safety Precautions: Patient was assessed for positional comfort and pressure points before starting the procedure. Time-out: I initiated and conducted the "Time-out" before starting the procedure, as per protocol. The patient was asked to participate by confirming the accuracy of the "Time Out" information. Verification of the correct person, site, and procedure were performed and confirmed by me, the nursing staff, and the patient. "Time-out" conducted as per Joint Commission's Universal Protocol (UP.01.01.01). "Time-out" Date & Time: 11/17/2016; 0922 hrs.  Description of Procedure #1 Process:   Time-out: "Time-out" completed before starting procedure, as per protocol. Position: Prone Target Area: For Lumbar Facet blocks, the target is the groove formed by the junction of the transverse process and superior articular process. For the L5 dorsal ramus, the target is the notch between superior articular process and sacral ala. For the S1 dorsal ramus, the target is the superior and lateral edge of the posterior S1 Sacral foramen. Approach: Paramedial approach. Area Prepped: Entire Posterior Lumbosacral Region Prepping solution: ChloraPrep (2% chlorhexidine gluconate and 70% isopropyl alcohol) Safety Precautions: Aspiration looking for blood return was conducted prior to all injections. At no point did we inject any substances, as a needle was being advanced. No attempts were made at seeking any paresthesias. Safe injection practices and needle disposal techniques used. Medications properly checked for expiration dates. SDV (single dose vial) medications used.   Description of the Procedure: Protocol guidelines were followed. The patient was placed in position over the fluoroscopy table. The target area was identified and the area prepped in the usual manner. Skin desensitized using vapocoolant spray. Skin & deeper tissues infiltrated with local anesthetic. Appropriate amount of time allowed to pass for local anesthetics to take effect. The procedure needle was introduced through the skin, ipsilateral to the reported pain, and advanced to the  target area. Employing the "Medial Branch Technique", the needles were advanced to the angle made by the superior and medial portion of the transverse process, and the lateral and inferior portion of the superior articulating process of the targeted vertebral bodies. This area is known as "Burton's Eye" or the "Eye of the [place] Dog". A procedure needle was introduced through the skin, and this time advanced to the angle made by the superior and medial border of the sacral ala, and the lateral border of the S1 vertebral body. This last needle was later repositioned at the superior and lateral border of the posterior S1 foramen. Negative aspiration confirmed. Solution injected in intermittent fashion, asking for systemic symptoms every 0.5cc of injectate. The needles were then removed and the area cleansed, making sure to leave some of the prepping solution back to take advantage of its long term bactericidal properties. Start Time: 0922 hrs. Materials:  Needle(s) Type: Regular needle Gauge: 22G Length: 3.5-in Medication(s): We administered lactated ringers, fentaNYL, lidocaine, triamcinolone acetonide, ropivacaine (PF) 2 mg/mL (0.2%), triamcinolone acetonide, ropivacaine (PF) 2 mg/mL (0.2%), ropivacaine (PF) 2 mg/mL (0.2%), and methylPREDNISolone acetate. Please see chart orders for dosing details.  Description of Procedure # 2 Process:   Position: Prone Target Area: For upper sacroiliac joint block(s), the target is the  superior and posterior margin of the sacroiliac joint. Approach: Ipsilateral approach. Area Prepped: Entire Posterior Lumbosacral Region Prepping solution: ChloraPrep (2% chlorhexidine gluconate and 70% isopropyl alcohol) Safety Precautions: Aspiration looking for blood return was conducted prior to all injections. At no point did we inject any substances, as a needle was being advanced. No attempts were made at seeking any paresthesias. Safe injection practices and needle disposal techniques used. Medications properly checked for expiration dates. SDV (single dose vial) medications used. Description of the Procedure: Protocol guidelines were followed. The patient was placed in position over the fluoroscopy table. The target area was identified and the area prepped in the usual manner. Skin desensitized using vapocoolant spray. Skin & deeper tissues infiltrated with local anesthetic. Appropriate amount of time allowed to pass for local anesthetics to take effect. The procedure needle was advanced under fluoroscopic guidance into the sacroiliac joint until a firm endpoint was obtained. Proper needle placement secured. Negative aspiration confirmed. Solution injected in intermittent fashion, asking for systemic symptoms every 0.5cc of injectate. The needles were then removed and the area cleansed, making sure to leave some of the prepping solution back to take advantage of its long term bactericidal properties. Vitals:   11/17/16 0940 11/17/16 0950 11/17/16 1000 11/17/16 1010  BP: 136/70 (!) 143/73 (!) 142/74 (!) 156/82  Pulse:      Resp: Temp:  (!) 96.9 F (36.1 C)    TempSrc:  Temporal    SpO2: 97% 92% 93% 95%  Weight:      Height:        End Time: 0938 hrs. Materials:  Needle(s) Type: Regular needle Gauge: 22G Length: 3.5-in Medication(s): We administered lactated ringers, fentaNYL, lidocaine, triamcinolone acetonide, ropivacaine (PF) 2 mg/mL (0.2%), triamcinolone acetonide,  ropivacaine (PF) 2 mg/mL (0.2%), ropivacaine (PF) 2 mg/mL (0.2%), and methylPREDNISolone acetate. Please see chart orders for dosing details.  Imaging Guidance (Spinal):  Type of Imaging Technique: Fluoroscopy Guidance (Spinal) Indication(s): Assistance in needle guidance and placement for procedures requiring needle placement in or near specific anatomical locations not easily accessible without such assistance. Exposure Time: Please see nurses notes. Contrast: None used. Fluoroscopic Guidance: I was personally present  during the use of fluoroscopy. "Tunnel Vision Technique" used to obtain the best possible view of the target area. Parallax error corrected before commencing the procedure. "Direction-depth-direction" technique used to introduce the needle under continuous pulsed fluoroscopy. Once target was reached, antero-posterior, oblique, and lateral fluoroscopic projection used confirm needle placement in all planes. Images permanently stored in EMR. Interpretation: No contrast injected. I personally interpreted the imaging intraoperatively. Adequate needle placement confirmed in multiple planes. Permanent images saved into the patient's record.  Antibiotic Prophylaxis:  Indication(s): None identified Antibiotic given: None  Post-operative Assessment:  EBL: None Complications: No immediate post-treatment complications observed by team, or reported by patient. Note: The patient tolerated the entire procedure well. A repeat set of vitals were taken after the procedure and the patient was kept under observation following institutional policy, for this type of procedure. Post-procedural neurological assessment was performed, showing return to baseline, prior to discharge. The patient was provided with post-procedure discharge instructions, including a section on how to identify potential problems. Should any problems arise concerning this procedure, the patient was given instructions to immediately  contact us, at any time, without hesitation. In any case, we plan to contact the patient by telephone for a follow-up status report regarding this interventional procedure. Comments:  No additional relevant information.  Plan of Care    Imaging Orders     DG C-Arm 1-60 Min-No Report  Procedure Orders     LUMBAR FACET(MEDIAL BRANCH NERVE BLOCK) MBNB     SACROILIAC JOINT INJECTINS  Medications ordered for procedure: Meds ordered this encounter  Medications  . lactated ringers infusion 1,000 mL  . midazolam (VERSED) 5 MG/5ML injection 1-2 mg    Make sure Flumazenil is available in the pyxis when using this medication. If oversedation occurs, administer 0.2 mg IV over 15 sec. If after 45 sec no response, administer 0.2 mg again over 1 min; may repeat at 1 min intervals; not to exceed 4 doses (1 mg)  . fentaNYL (SUBLIMAZE) injection 25-50 mcg    Make sure Narcan is available in the pyxis when using this medication. In the event of respiratory depression (RR< 8/min): Titrate NARCAN (naloxone) in increments of 0.1 to 0.2 mg IV at 2-3 minute intervals, until desired degree of reversal.  . lidocaine (XYLOCAINE) 2 % (with pres) injection 200 mg  . triamcinolone acetonide (KENALOG-40) injection 40 mg  . ropivacaine (PF) 2 mg/mL (0.2%) (NAROPIN) injection 9 mL  . triamcinolone acetonide (KENALOG-40) injection 40 mg  . ropivacaine (PF) 2 mg/mL (0.2%) (NAROPIN) injection 9 mL  . ropivacaine (PF) 2 mg/mL (0.2%) (NAROPIN) injection 9 mL  . methylPREDNISolone acetate (DEPO-MEDROL) injection 80 mg   Medications administered: We administered lactated ringers, fentaNYL, lidocaine, triamcinolone acetonide, ropivacaine (PF) 2 mg/mL (0.2%), triamcinolone acetonide, ropivacaine (PF) 2 mg/mL (0.2%), ropivacaine (PF) 2 mg/mL (0.2%), and methylPREDNISolone acetate.  See the medical record for exact dosing, route, and time of administration.  New Prescriptions   No medications on file   Disposition:  Discharge home  Discharge Date & Time: 11/17/2016; 1015 hrs.   Physician-requested Follow-up: Return for post-procedure eval by Dr. Laban Emperor in 2 wks. Future Appointments Date Time Provider Department Center  12/05/2016 9:15 AM Delano Metz, MD [facility] None   Primary Care Physician: Corky Downs, MD Location: [facility] Note by: Oswaldo Done, MD Date: 11/17/2016; Time: 10:40 AM  Disclaimer:  Medicine is not an exact science. The only guarantee in medicine is that nothing is guaranteed. It is important  to note that the decision to proceed with this intervention was based on the information collected from the patient. The Data and conclusions were drawn from the patient's questionnaire, the interview, and the physical examination. Because the information was provided in large part by the patient, it cannot be guaranteed that it has not been purposely or unconsciously manipulated. Every effort has been made to obtain as much relevant data as possible for this evaluation. It is important to note that the conclusions that lead to this procedure are derived in large part from the available data. Always take into account that the treatment will also be dependent on availability of resources and existing treatment guidelines, considered by other Pain Management Practitioners as being common knowledge and practice, at the time of the intervention. For Medico-Legal purposes, it is also important to point out that variation in procedural techniques and pharmacological choices are the acceptable norm. The indications, contraindications, technique, and results of the above procedure should only be interpreted and judged by a Board-Certified Interventional Pain Specialist with extensive familiarity and expertise in the same exact procedure and technique.

## 2016-11-18 ENCOUNTER — Telehealth: Payer: Self-pay | Admitting: *Deleted

## 2016-11-18 NOTE — Telephone Encounter (Signed)
Spoke with patient re; procedure on yesterday, verbalizes no questions or concerns.  

## 2016-12-02 DIAGNOSIS — K210 Gastro-esophageal reflux disease with esophagitis: Secondary | ICD-10-CM | POA: Diagnosis not present

## 2016-12-02 DIAGNOSIS — H0014 Chalazion left upper eyelid: Secondary | ICD-10-CM | POA: Diagnosis not present

## 2016-12-02 DIAGNOSIS — I2510 Atherosclerotic heart disease of native coronary artery without angina pectoris: Secondary | ICD-10-CM | POA: Diagnosis not present

## 2016-12-02 DIAGNOSIS — G43909 Migraine, unspecified, not intractable, without status migrainosus: Secondary | ICD-10-CM | POA: Diagnosis not present

## 2016-12-02 DIAGNOSIS — I739 Peripheral vascular disease, unspecified: Secondary | ICD-10-CM | POA: Diagnosis not present

## 2016-12-05 ENCOUNTER — Ambulatory Visit: Payer: Medicare HMO | Attending: Pain Medicine | Admitting: Pain Medicine

## 2016-12-05 NOTE — Progress Notes (Deleted)
Patient's Name: Sabrina Barker  MRN: [id number]  Referring Provider: Cletis Athens, MD  DOB: [birth year]  PCP: Cletis Athens, MD  DOS: 12/05/2016  Note by: Gaspar Cola, MD  Service setting: Ambulatory outpatient  Specialty: Interventional Pain Management  Location: [facility] (AMB) Pain Management Facility    Patient type: Established   Primary Reason(s) for Visit: Encounter for prescription drug management & post-procedure evaluation of chronic illness with mild to moderate exacerbation(Level of risk: moderate) CC: No chief complaint on file.  HPI  Sabrina Barker is a 66 y.o. year old, female patient, who comes today for a post-procedure evaluation and medication management. She has Chronic pain syndrome; Chronic neck pain (Primary Area of Pain) (Bilateral)( (L>R); Chronic low back pain [facility] Area of Pain) (Bilateral) (R>L); Chronic thoracic back pain (Fourth Area of Pain) (Bilateral) (R>L); Chronic lower extremity pain (Bilateral); Chronic sacroiliac joint pain (Bilateral); Disorder of skeletal system; Pharmacologic therapy; Problems influencing health status; Generalized anxiety disorder; Chronic upper extremity pain (Secondary Area of Pain) (Bilateral) (L>R); Lumbar facet syndrome (Bilateral) (R>L); DDD (degenerative disc disease), thoracic; DDD (degenerative disc disease), cervical; Cervical facet hypertrophy (Bilateral); Cervical facet syndrome (Bilateral) (L>R); DDD (degenerative disc disease), lumbar (L5-S1); Long term prescription benzodiazepine use; Osteoarthritis of hip (Bilateral); Opiate use; Long term prescription opiate use; Lumbar facet osteoarthritis; and Chronic sacroiliac joint arthropathy (Bilateral) on her problem list. Her primarily concern today is the No chief complaint on file.  Pain Assessment: Location:     Radiating:   Onset:   Duration:   Quality:   Severity:  /10 (self-reported pain score)  Note: Reported level is compatible with observation.                          When using our objective Pain Scale, levels between 6 and 10/10 are said to belong in an emergency room, as it progressively worsens from a 6/10, described as severely limiting, requiring emergency care not usually available at an outpatient pain management facility. At a 6/10 level, communication becomes difficult and requires great effort. Assistance to reach the emergency department may be required. Facial flushing and profuse sweating along with potentially dangerous increases in heart rate and blood pressure will be evident. Effect on ADL:   Timing:   Modifying factors:    Sabrina Barker was last seen on 11/17/2016 for a procedure. During today's appointment we reviewed Sabrina Barker's post-procedure results, as well as her outpatient medication regimen.  Further details on both, my assessment(s), as well as the proposed treatment plan, please see below.  Controlled Substance Pharmacotherapy Assessment REMS (Risk Evaluation and Mitigation Strategy)  Analgesic: Tramadol 50 mg 1 tablet by mouth every 6 hours (200 mg/day of tramadol) (20 MME/day) MME/day: 20 mg/day.  No notes on file Pharmacokinetics: Liberation and absorption (onset of action): WNL Distribution (time to peak effect): WNL Metabolism and excretion (duration of action): WNL         Pharmacodynamics: Desired effects: Analgesia: [name] reports >50% benefit. Functional ability: Patient reports that medication allows her to accomplish basic ADLs Clinically meaningful improvement in function (CMIF): Sustained CMIF goals met Perceived effectiveness: Described as relatively effective, allowing for increase in activities of daily living (ADL) Undesirable effects: Side-effects or Adverse reactions: None reported Monitoring: [place] PMP: Online review of the past 4-monthperiod conducted. Compliant with practice rules and regulations Last UDS on record: Summary  Date Value Ref Range Status  09/21/2016 FINAL  Final    Comment:     ====================================================================  [facility] COMP DRUG ANALYSIS,UR ==================================================================== Test                             Result       Flag       Units Drug Present and Declared for Prescription Verification   Gabapentin                     PRESENT      EXPECTED   Sertraline                     PRESENT      EXPECTED   Desmethylsertraline            PRESENT      EXPECTED    Desmethylsertraline is an expected metabolite of sertraline.   Trazodone                      PRESENT      EXPECTED   1,3 chlorophenyl piperazine    PRESENT      EXPECTED    1,3-chlorophenyl piperazine is an expected metabolite of    trazodone.   Diphenhydramine                PRESENT      EXPECTED   Metoprolol                     PRESENT      EXPECTED Drug Present not Declared for Prescription Verification   Oxazepam                       69           UNEXPECTED ng/mg creat    Oxazepam may be administered as a scheduled prescription    medication; it is also an expected metabolite of other    benzodiazepine drugs, including diazepam, chlordiazepoxide,    prazepam, clorazepate, halazepam, and temazepam.   Lorazepam                      281          UNEXPECTED ng/mg creat    Source of lorazepam is a scheduled prescription medication. Drug Absent but Declared for Prescription Verification   Salicylate                     Not Detected UNEXPECTED    Aspirin, as indicated in the declared medication list, is not    always detected even when used as directed.   Ibuprofen                      Not Detected UNEXPECTED    Ibuprofen, as indicated in the declared medication list, is not    always detected even when used as directed. ==================================================================== Test                      Result    Flag   Units      Ref Range   Creatinine              36               mg/dL       >=20 ==================================================================== Declared Medications:  The flagging and interpretation on this report are based on the  following declared medications.  Unexpected results  may arise from  inaccuracies in the declared medications.  **Note: The testing scope of this panel includes these medications:  Diphenhydramine (Benadryl)  Gabapentin  Metoprolol (Lopressor)  Sertraline (Zoloft)  Trazodone  **Note: The testing scope of this panel does not include small to  moderate amounts of these reported medications:  Aspirin (Aspirin 81)  Ibuprofen  **Note: The testing scope of this panel does not include following  reported medications:  Cimetidine (Tagamet)  Losartan (Cozaar)  Rosuvastatin (Crestor) ==================================================================== For clinical consultation, please call [phone]. ====================================================================    UDS interpretation: Compliant          Medication Assessment Form: Reviewed. Patient indicates being compliant with therapy Treatment compliance: Compliant Risk Assessment Profile: Aberrant behavior: See prior evaluations. None observed or detected today Comorbid factors increasing risk of overdose: See prior notes. No additional risks detected today Risk of substance use disorder (SUD): Low  ORT Scoring interpretation table:  Score <3 = Low Risk for SUD  Score between 4-7 = Moderate Risk for SUD  Score >8 = High Risk for Opioid Abuse   Risk Mitigation Strategies:  Patient Counseling: Covered Patient-Prescriber Agreement (PPA): Present and active  Notification to other healthcare providers: Done  Pharmacologic Plan: No change in therapy, at this time  Post-Procedure Assessment  11/17/2016 Procedure: Diagnostic bilateral lumbar facet block + bilateral sacroiliac joint block under fluoroscopic guidance and IV sedation. Pre-procedure pain score:   2/10 Post-procedure pain score: 0/10 (100% relief) Influential Factors: BMI:   Intra-procedural challenges: None observed.         Assessment challenges: None detected.              Reported side-effects: None.        Post-procedural adverse reactions or complications: None reported         Sedation: Sedation provided. When no sedatives are used, the analgesic levels obtained are directly associated to the effectiveness of the local anesthetics. However, when sedation is provided, the level of analgesia obtained during the initial 1 hour following the intervention, is believed to be the result of a combination of factors. These factors may include, but are not limited to: 1. The effectiveness of the local anesthetics used. 2. The effects of the analgesic(s) and/or anxiolytic(s) used. 3. The degree of discomfort experienced by the patient at the time of the procedure. 4. The patients ability and reliability in recalling and recording the events. 5. The presence and influence of possible secondary gains and/or psychosocial factors. Reported result: Relief experienced during the 1st hour after the procedure:   (Ultra-Short Term Relief)            Interpretative annotation: Clinically appropriate result. Analgesia during this period is likely to be Local Anesthetic and/or IV Sedative (Analgesic/Anxiolytic) related.          Effects of local anesthetic: The analgesic effects attained during this period are directly associated to the localized infiltration of local anesthetics and therefore cary significant diagnostic value as to the etiological location, or anatomical origin, of the pain. Expected duration of relief is directly dependent on the pharmacodynamics of the local anesthetic used. Long-acting (4-6 hours) anesthetics used.  Reported result: Relief during the next 4 to 6 hour after the procedure:   (Short-Term Relief)            Interpretative annotation: Clinically appropriate result.  Analgesia during this period is likely to be Local Anesthetic-related.          Long-term benefit: Defined as the period  of time past the expected duration of local anesthetics (1 hour for short-acting and 4-6 hours for long-acting). With the possible exception of prolonged sympathetic blockade from the local anesthetics, benefits during this period are typically attributed to, or associated with, other factors such as analgesic sensory neuropraxia, antiinflammatory effects, or beneficial biochemical changes provided by agents other than the local anesthetics.  Reported result: Extended relief following procedure:   (Long-Term Relief)            Interpretative annotation: Clinically appropriate result. Good relief. No permanent benefit expected. Inflammation plays a part in the etiology to the pain.          Current benefits: Defined as reported results that persistent at this point in time.   Analgesia: *** %            Function: Somewhat improved ROM: Somewhat improved Interpretative annotation: Recurrence of symptoms. No permanent benefit expected. Effective diagnostic intervention.          Interpretation: Results would suggest a successful diagnostic intervention.                  Plan:  Please see "Plan of Care" for details.        Laboratory Chemistry  Inflammation Markers (CRP: Acute Phase) (ESR: Chronic Phase) Lab Results  Component Value Date   CRP 6.3 (H) 09/21/2016   ESRSEDRATE 10 09/21/2016                 Renal Function Markers Lab Results  Component Value Date   BUN 13 09/21/2016   CREATININE 0.86 09/21/2016   GFRAA 83 09/21/2016   GFRNONAA 72 09/21/2016                 Hepatic Function Markers Lab Results  Component Value Date   AST 21 09/21/2016   ALT 22 09/21/2016   ALBUMIN 4.3 09/21/2016   ALKPHOS 71 09/21/2016                 Electrolytes Lab Results  Component Value Date   NA 142 09/21/2016   K 4.1 09/21/2016   CL 103 09/21/2016   CALCIUM 9.5  09/21/2016   MG 2.0 09/21/2016                 Neuropathy Markers Lab Results  Component Value Date   HOZYYQMG50 037 09/21/2016                 Bone Pathology Markers Lab Results  Component Value Date   ALKPHOS 71 09/21/2016   25OHVITD1 39 09/21/2016   25OHVITD2 <1.0 09/21/2016   25OHVITD3 39 09/21/2016   CALCIUM 9.5 09/21/2016                 Coagulation Parameters No results found for: INR, LABPROT, APTT, PLT               Cardiovascular Markers No results found for: BNP, HGB, HCT               Note: Lab results reviewed.  Recent Diagnostic Imaging Results  DG C-Arm 1-60 Min-No Report Fluoroscopy was utilized by the requesting physician.  No radiographic  interpretation.   Complexity Note: Imaging results reviewed. Results shared with Ms. Vickerman, using Layman's terms.                         Meds   Current Outpatient Prescriptions:  .  aspirin EC 81 MG tablet,  Take 81 mg by mouth daily., Disp: , Rfl:  .  cimetidine (TAGAMET) 300 MG tablet, Take 300 mg by mouth 2 (two) times daily., Disp: , Rfl:  .  diphenhydrAMINE (BENADRYL) 25 mg capsule, Take 25 mg by mouth 2 (two) times daily., Disp: , Rfl:  .  gabapentin (NEURONTIN) 400 MG capsule, Take 400 mg by mouth at bedtime., Disp: , Rfl:  .  ibuprofen (ADVIL,MOTRIN) 200 MG tablet, Take 600 mg by mouth 2 (two) times daily., Disp: , Rfl:  .  losartan (COZAAR) 25 MG tablet, Take 25 mg by mouth daily., Disp: , Rfl: 4 .  metoprolol tartrate (LOPRESSOR) 25 MG tablet, Take 25 mg by mouth 2 (two) times daily., Disp: , Rfl:  .  rosuvastatin (CRESTOR) 5 MG tablet, Take 5 mg by mouth daily., Disp: , Rfl: 4 .  sertraline (ZOLOFT) 100 MG tablet, Take 100 mg by mouth 2 (two) times daily., Disp: , Rfl:  .  traMADol (ULTRAM) 50 MG tablet, Take 1 tablet (50 mg total) by mouth every 6 (six) hours as needed for severe pain., Disp: 120 tablet, Rfl: 0 .  traZODone (DESYREL) 100 MG tablet, Take 100 mg by mouth at bedtime., Disp: , Rfl:  .   VENTOLIN HFA 108 (90 Base) MCG/ACT inhaler, Take 2 puffs by mouth as needed., Disp: , Rfl:   ROS  Constitutional: Denies any fever or chills Gastrointestinal: No reported hemesis, hematochezia, vomiting, or acute GI distress Musculoskeletal: Denies any acute onset joint swelling, redness, loss of ROM, or weakness Neurological: No reported episodes of acute onset apraxia, aphasia, dysarthria, agnosia, amnesia, paralysis, loss of coordination, or loss of consciousness  Allergies  Ms. Samet is allergic to penicillins and shellfish allergy.  PFSH  Drug: Ms. Mossbarger  reports that she does not use drugs. Alcohol:  reports that she does not drink alcohol. Tobacco:  reports that she has been smoking Cigarettes.  She has been smoking about 0.15 packs per day. She has never used smokeless tobacco. Medical:  has a past medical history of Allergy; Anxiety; Asthma; DDD (degenerative disc disease), thoracic (11/17/2016); Depression; GERD (gastroesophageal reflux disease); Heart murmur; Hyperlipidemia; and Hypertension. Surgical: Ms. Lorton  has a past surgical history that includes Abdominal hysterectomy; Appendectomy; tummy tuck (2016); rhinoplasty (2016); Facial cosmetic surgery (2016); and Laparoscopy abdomen diagnostic (2016). Family: family history includes Cancer in her mother; Heart disease in her brother, brother, father, and mother; Stroke in her mother.  Constitutional Exam  General appearance: Well nourished, well developed, and well hydrated. In no apparent acute distress There were no vitals filed for this visit. BMI Assessment: Estimated body mass index is 31.62 kg/m as calculated from the following:   Height as of 11/17/16: '5\' 5"'  (1.651 m).   Weight as of 11/17/16: 190 lb (86.2 kg).  BMI interpretation table: BMI level Category Range association with higher incidence of chronic pain  <18 kg/m2 Underweight   18.5-24.9 kg/m2 Ideal body weight   25-29.9 kg/m2 Overweight Increased  incidence by 20%  30-34.9 kg/m2 Obese (Class I) Increased incidence by 68%  35-39.9 kg/m2 Severe obesity (Class II) Increased incidence by 136%  >40 kg/m2 Extreme obesity (Class III) Increased incidence by 254%   BMI Readings from Last 4 Encounters:  11/17/16 31.62 kg/m  11/07/16 31.78 kg/m  09/21/16 33.28 kg/m   Wt Readings from Last 4 Encounters:  11/17/16 190 lb (86.2 kg)  11/07/16 191 lb (86.6 kg)  09/21/16 200 lb (90.7 kg)  Psych/Mental status: Alert, oriented x 3 (  person, place, & time)       Eyes: PERLA Respiratory: No evidence of acute respiratory distress  Cervical Spine Area Exam  Skin & Axial Inspection: No masses, redness, edema, swelling, or associated skin lesions Alignment: Symmetrical Functional ROM: Unrestricted ROM      Stability: No instability detected Muscle Tone/Strength: Functionally intact. No obvious neuro-muscular anomalies detected. Sensory (Neurological): Unimpaired Palpation: No palpable anomalies              Upper Extremity (UE) Exam    Side: Right upper extremity  Side: Left upper extremity  Skin & Extremity Inspection: Skin color, temperature, and hair growth are WNL. No peripheral edema or cyanosis. No masses, redness, swelling, asymmetry, or associated skin lesions. No contractures.  Skin & Extremity Inspection: Skin color, temperature, and hair growth are WNL. No peripheral edema or cyanosis. No masses, redness, swelling, asymmetry, or associated skin lesions. No contractures.  Functional ROM: Unrestricted ROM          Functional ROM: Unrestricted ROM          Muscle Tone/Strength: Functionally intact. No obvious neuro-muscular anomalies detected.  Muscle Tone/Strength: Functionally intact. No obvious neuro-muscular anomalies detected.  Sensory (Neurological): Unimpaired          Sensory (Neurological): Unimpaired          Palpation: No palpable anomalies              Palpation: No palpable anomalies              Specialized Test(s): Deferred          Specialized Test(s): Deferred          Thoracic Spine Area Exam  Skin & Axial Inspection: No masses, redness, or swelling Alignment: Symmetrical Functional ROM: Unrestricted ROM Stability: No instability detected Muscle Tone/Strength: Functionally intact. No obvious neuro-muscular anomalies detected. Sensory (Neurological): Unimpaired Muscle strength & Tone: No palpable anomalies  Lumbar Spine Area Exam  Skin & Axial Inspection: No masses, redness, or swelling Alignment: Symmetrical Functional ROM: Unrestricted ROM      Stability: No instability detected Muscle Tone/Strength: Functionally intact. No obvious neuro-muscular anomalies detected. Sensory (Neurological): Unimpaired Palpation: No palpable anomalies       Provocative Tests: Lumbar Hyperextension and rotation test: evaluation deferred today       Lumbar Lateral bending test: evaluation deferred today       Patrick's Maneuver: evaluation deferred today                    Gait & Posture Assessment  Ambulation: Unassisted Gait: Relatively normal for age and body habitus Posture: WNL   Lower Extremity Exam    Side: Right lower extremity  Side: Left lower extremity  Skin & Extremity Inspection: Skin color, temperature, and hair growth are WNL. No peripheral edema or cyanosis. No masses, redness, swelling, asymmetry, or associated skin lesions. No contractures.  Skin & Extremity Inspection: Skin color, temperature, and hair growth are WNL. No peripheral edema or cyanosis. No masses, redness, swelling, asymmetry, or associated skin lesions. No contractures.  Functional ROM: Unrestricted ROM          Functional ROM: Unrestricted ROM          Muscle Tone/Strength: Functionally intact. No obvious neuro-muscular anomalies detected.  Muscle Tone/Strength: Functionally intact. No obvious neuro-muscular anomalies detected.  Sensory (Neurological): Unimpaired  Sensory (Neurological): Unimpaired  Palpation: No palpable anomalies   Palpation: No palpable anomalies   Assessment  Primary Diagnosis & Pertinent Problem List:  There were no encounter diagnoses.  Status Diagnosis  Controlled Controlled Controlled No diagnosis found.  Problems updated and reviewed during this visit: No problems updated. Plan of Care  Pharmacotherapy (Medications Ordered): No orders of the defined types were placed in this encounter.  New Prescriptions   No medications on file   Medications administered today: Ms. Lawn had no medications administered during this visit.  Procedure Orders    No procedure(s) ordered today   Lab Orders  No laboratory test(s) ordered today   Imaging Orders  No imaging studies ordered today   Referral Orders  No referral(s) requested today    Interventional management options: Planned, scheduled, and/or pending:   Diagnostic bilateral lumbar facet block + bilateral sacroiliac joint block #2 under fluoroscopic guidance and IV sedation.   Considering:   Diagnosticbilateral cervical epidural steroid injection Diagnosticbilateral cervical facet block Possible bilateral cervical radiofrequency ablation Diagnosticbilateral lumbar epidural steroid injection Diagnosticbilateral lumbar facet block Possible bilateral lumbar radiofrequency Diagnosticsacroiliac joint injection    Palliative PRN treatment(s):   None at this time   Provider-requested follow-up: No Follow-up on file.  Future Appointments Date Time Provider [place]  12/05/2016 9:15 AM Milinda Pointer, MD [facility] None   Primary Care Physician: Cletis Athens, MD Location: [facility] Note by: Gaspar Cola, MD Date: 12/05/2016; Time: 7:31 AM
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
## 2019-11-20 ENCOUNTER — Other Ambulatory Visit: Payer: Self-pay | Admitting: Obstetrics and Gynecology

## 2019-11-20 DIAGNOSIS — Z1231 Encounter for screening mammogram for malignant neoplasm of breast: Secondary | ICD-10-CM

## 2020-01-14 ENCOUNTER — Other Ambulatory Visit: Payer: Self-pay

## 2020-01-14 ENCOUNTER — Ambulatory Visit: Payer: 59 | Admitting: Family Medicine

## 2020-02-17 ENCOUNTER — Telehealth: Payer: Self-pay

## 2020-02-17 NOTE — Telephone Encounter (Signed)
Copied from CRM [id number]. Topic: Appointment Scheduling - Scheduling Inquiry for Clinic >> Feb 17, 2020  8:12 AM Crist Infante wrote: Reason for CRM: pt states she is running out of all her meds, especially blood pressure med, and requesting sooner appt than 03/20/20

## 2020-02-27 ENCOUNTER — Other Ambulatory Visit: Payer: Medicare Other

## 2020-02-27 DIAGNOSIS — Z20822 Contact with and (suspected) exposure to covid-19: Secondary | ICD-10-CM

## 2020-02-29 ENCOUNTER — Ambulatory Visit: Payer: Self-pay

## 2020-02-29 LAB — NOVEL CORONAVIRUS, NAA: SARS-CoV-2, NAA: DETECTED — AB

## 2020-02-29 LAB — SARS-COV-2, NAA 2 DAY TAT

## 2020-02-29 NOTE — Telephone Encounter (Signed)
Pt c/o positive- and having no sx. Advised pt that covid vaccine does not cause a positive covid  test. Pt has been vaccinated x 2 shots and advised that is the reason she is having no sx. Pt verbalized understanding. Advised other member to get tested on day 5 after pt tested positive.  Reason for Disposition . Health Information question, no triage required and triager able to answer question  Answer Assessment - Initial Assessment Questions 1. REASON FOR CALL or QUESTION: "What is your reason for calling today?" or "How can I best help you?" or "What question do you have that I can help answer?"     Can covid vaccine cause covid  Protocols used: INFORMATION ONLY CALL - NO TRIAGE-A-AH

## 2020-03-19 ENCOUNTER — Other Ambulatory Visit: Payer: Self-pay

## 2020-03-19 ENCOUNTER — Emergency Department: Payer: Medicare Other

## 2020-03-19 ENCOUNTER — Inpatient Hospital Stay
Admission: EM | Admit: 2020-03-19 | Discharge: 2020-03-21 | DRG: 247 | Disposition: A | Discharge status: Home / Self Care | Payer: Medicare Other | Attending: Internal Medicine | Admitting: Internal Medicine

## 2020-03-19 ENCOUNTER — Encounter
Admission: EM | Disposition: A | Discharge status: Home / Self Care | Payer: Self-pay | Source: Home / Self Care | Attending: Internal Medicine

## 2020-03-19 DIAGNOSIS — Z87892 Personal history of anaphylaxis: Secondary | ICD-10-CM

## 2020-03-19 DIAGNOSIS — F17210 Nicotine dependence, cigarettes, uncomplicated: Secondary | ICD-10-CM | POA: Diagnosis present

## 2020-03-19 DIAGNOSIS — J449 Chronic obstructive pulmonary disease, unspecified: Secondary | ICD-10-CM

## 2020-03-19 DIAGNOSIS — I10 Essential (primary) hypertension: Secondary | ICD-10-CM

## 2020-03-19 DIAGNOSIS — I25110 Atherosclerotic heart disease of native coronary artery with unstable angina pectoris: Secondary | ICD-10-CM

## 2020-03-19 DIAGNOSIS — Z79899 Other long term (current) drug therapy: Secondary | ICD-10-CM

## 2020-03-19 DIAGNOSIS — I214 Non-ST elevation (NSTEMI) myocardial infarction: Principal | ICD-10-CM

## 2020-03-19 DIAGNOSIS — Z7982 Long term (current) use of aspirin: Secondary | ICD-10-CM

## 2020-03-19 DIAGNOSIS — I200 Unstable angina: Secondary | ICD-10-CM

## 2020-03-19 DIAGNOSIS — Z8249 Family history of ischemic heart disease and other diseases of the circulatory system: Secondary | ICD-10-CM

## 2020-03-19 DIAGNOSIS — T463X5A Adverse effect of coronary vasodilators, initial encounter: Secondary | ICD-10-CM | POA: Diagnosis not present

## 2020-03-19 DIAGNOSIS — Z20822 Contact with and (suspected) exposure to covid-19: Secondary | ICD-10-CM | POA: Diagnosis present

## 2020-03-19 DIAGNOSIS — F419 Anxiety disorder, unspecified: Secondary | ICD-10-CM | POA: Diagnosis present

## 2020-03-19 DIAGNOSIS — E785 Hyperlipidemia, unspecified: Secondary | ICD-10-CM

## 2020-03-19 DIAGNOSIS — I2510 Atherosclerotic heart disease of native coronary artery without angina pectoris: Secondary | ICD-10-CM | POA: Diagnosis present

## 2020-03-19 DIAGNOSIS — Z880 Allergy status to penicillin: Secondary | ICD-10-CM

## 2020-03-19 DIAGNOSIS — I252 Old myocardial infarction: Secondary | ICD-10-CM

## 2020-03-19 DIAGNOSIS — R079 Chest pain, unspecified: Secondary | ICD-10-CM | POA: Diagnosis present

## 2020-03-19 DIAGNOSIS — G4440 Drug-induced headache, not elsewhere classified, not intractable: Secondary | ICD-10-CM | POA: Diagnosis not present

## 2020-03-19 DIAGNOSIS — R339 Retention of urine, unspecified: Secondary | ICD-10-CM | POA: Diagnosis present

## 2020-03-19 DIAGNOSIS — I249 Acute ischemic heart disease, unspecified: Secondary | ICD-10-CM

## 2020-03-19 HISTORY — DX: Chronic obstructive pulmonary disease, unspecified: J44.9

## 2020-03-19 HISTORY — DX: Essential (primary) hypertension: I10

## 2020-03-19 LAB — CBC WITH DIFFERENTIAL/PLATELET
Abs Immature Granulocytes: 0.02 10*3/uL (ref 0.00–0.07)
Basophils Absolute: 0.1 10*3/uL (ref 0.0–0.1)
Basophils Relative: 1 %
Eosinophils Absolute: 0.2 10*3/uL (ref 0.0–0.5)
Eosinophils Relative: 2 %
HCT: 36 % (ref 36.0–46.0)
Hemoglobin: 11.9 g/dL — ABNORMAL LOW (ref 12.0–15.0)
Immature Granulocytes: 0 %
Lymphocytes Relative: 32 %
Lymphs Abs: 2.8 10*3/uL (ref 0.7–4.0)
MCH: 29.6 pg (ref 26.0–34.0)
MCHC: 33.1 g/dL (ref 30.0–36.0)
MCV: 89.6 fL (ref 80.0–100.0)
Monocytes Absolute: 0.5 10*3/uL (ref 0.1–1.0)
Monocytes Relative: 6 %
Neutro Abs: 5.2 10*3/uL (ref 1.7–7.7)
Neutrophils Relative %: 59 %
Platelets: 209 10*3/uL (ref 150–400)
RBC: 4.02 MIL/uL (ref 3.87–5.11)
RDW: 12.1 % (ref 11.5–15.5)
WBC: 8.8 10*3/uL (ref 4.0–10.5)
nRBC: 0 % (ref 0.0–0.2)

## 2020-03-19 LAB — COMPREHENSIVE METABOLIC PANEL
ALT: 13 U/L (ref 0–44)
AST: 22 U/L (ref 15–41)
Albumin: 4.2 g/dL (ref 3.5–5.0)
Alkaline Phosphatase: 70 U/L (ref 38–126)
Anion gap: 12 (ref 5–15)
BUN: 34 mg/dL — ABNORMAL HIGH (ref 8–23)
CO2: 25 mmol/L (ref 22–32)
Calcium: 9.5 mg/dL (ref 8.9–10.3)
Chloride: 100 mmol/L (ref 98–111)
Creatinine, Ser: 1.01 mg/dL — ABNORMAL HIGH (ref 0.44–1.00)
GFR, Estimated: >60 mL/min (ref >60)
Glucose, Bld: 139 mg/dL — ABNORMAL HIGH (ref 70–99)
Potassium: 3.5 mmol/L (ref 3.5–5.1)
Sodium: 137 mmol/L (ref 135–145)
Total Bilirubin: 0.5 mg/dL (ref 0.3–1.2)
Total Protein: 7.2 g/dL (ref 6.5–8.1)

## 2020-03-19 LAB — PROTIME-INR
INR: 1 (ref 0.8–1.2)
Prothrombin Time: 12.9 seconds (ref 11.4–15.2)

## 2020-03-19 LAB — CBG MONITORING, ED: Glucose-Capillary: 145 mg/dL — ABNORMAL HIGH (ref 70–99)

## 2020-03-19 LAB — TROPONIN I (HIGH SENSITIVITY): Troponin I (High Sensitivity): 5 ng/L (ref <18)

## 2020-03-19 LAB — APTT: aPTT: 30 seconds (ref 24–36)

## 2020-03-19 SURGERY — INVASIVE LAB ABORTED CASE

## 2020-03-19 MED ORDER — SODIUM CHLORIDE 0.9 % IV SOLN: INTRAVENOUS | Status: DC

## 2020-03-19 MED ORDER — ONDANSETRON HCL 4 MG/2ML IJ SOLN: 4.0000 mg | Freq: Four times a day (QID) | INTRAMUSCULAR | Status: DC | PRN

## 2020-03-19 MED ORDER — ASPIRIN EC 81 MG PO TBEC
81.0000 mg | DELAYED_RELEASE_TABLET | Freq: Every day | ORAL | Status: DC
  Administered 2020-03-20 – 2020-03-21 (×2): 81 mg via ORAL
  Filled 2020-03-19 (×2): qty 1

## 2020-03-19 MED ORDER — ACETAMINOPHEN 325 MG PO TABS
650.0000 mg | ORAL_TABLET | ORAL | Status: DC | PRN
  Administered 2020-03-20 (×2): 650 mg via ORAL
  Filled 2020-03-19 (×2): qty 2

## 2020-03-19 MED ORDER — LORAZEPAM 2 MG/ML IJ SOLN
1.0000 mg | Freq: Once | INTRAMUSCULAR | Status: AC
  Administered 2020-03-19: 1 mg via INTRAVENOUS
  Filled 2020-03-19: qty 1

## 2020-03-19 MED ORDER — ATORVASTATIN CALCIUM 80 MG PO TABS
80.0000 mg | ORAL_TABLET | Freq: Every day | ORAL | Status: DC
  Administered 2020-03-19 – 2020-03-21 (×3): 80 mg via ORAL
  Filled 2020-03-19: qty 1
  Filled 2020-03-19 (×2): qty 4

## 2020-03-19 MED ORDER — ALPRAZOLAM 0.25 MG PO TABS
0.2500 mg | ORAL_TABLET | Freq: Two times a day (BID) | ORAL | Status: DC | PRN
  Administered 2020-03-20 – 2020-03-21 (×3): 0.25 mg via ORAL
  Filled 2020-03-19 (×3): qty 1

## 2020-03-19 MED ORDER — HEPARIN (PORCINE) 25000 UT/250ML-% IV SOLN
1000.0000 [IU]/h | INTRAVENOUS | Status: DC
  Administered 2020-03-19: 850 [IU]/h via INTRAVENOUS
  Filled 2020-03-19: qty 250

## 2020-03-19 MED ORDER — NITROGLYCERIN 0.4 MG SL SUBL: 0.4000 mg | SUBLINGUAL_TABLET | SUBLINGUAL | Status: DC | PRN

## 2020-03-19 MED ORDER — ASPIRIN 81 MG PO CHEW: 324.0000 mg | CHEWABLE_TABLET | ORAL | Status: DC

## 2020-03-19 MED ORDER — METOPROLOL TARTRATE 25 MG PO TABS
12.5000 mg | ORAL_TABLET | Freq: Two times a day (BID) | ORAL | Status: DC
  Administered 2020-03-19 – 2020-03-21 (×3): 12.5 mg via ORAL
  Filled 2020-03-19 (×3): qty 1

## 2020-03-19 MED ORDER — HEPARIN BOLUS VIA INFUSION
4000.0000 [IU] | Freq: Once | INTRAVENOUS | Status: AC
  Administered 2020-03-19: 4000 [IU] via INTRAVENOUS
  Filled 2020-03-19: qty 4000

## 2020-03-19 MED ORDER — MORPHINE SULFATE (PF) 2 MG/ML IV SOLN: 2.0000 mg | INTRAVENOUS | Status: DC | PRN

## 2020-03-19 MED ORDER — ZOLPIDEM TARTRATE 5 MG PO TABS: 5.0000 mg | ORAL_TABLET | Freq: Every evening | ORAL | Status: DC | PRN

## 2020-03-19 MED ORDER — ASPIRIN 300 MG RE SUPP: 300.0000 mg | RECTAL | Status: DC

## 2020-03-19 NOTE — ED Notes (Signed)
Patient spoke with patient's sister with patient verbal consent. Provided update regarding treatment and plan of care to this point. Patient's sister verbalized understanding of all discussed and denies further questions at this time.

## 2020-03-19 NOTE — ED Provider Notes (Signed)
[facility] Emergency Department Provider Note  ____________________________________________   Event Date/Time   First MD Initiated Contact with Patient 03/19/20 1953     (approximate)  I have reviewed the triage vital signs and the nursing notes.   HISTORY  Chief Complaint Chest Pain    HPI Sabrina Barker is a 70 y.o. female with history of hypertension, COPD, coronary disease, here with chest pain.  The patient states she was in her usual state of health until just prior to arrival.  She was resting at home when she experienced acute onset of severe, aching, pressure-like chest pain.  She had associated shortness of breath and broke out in a sweat.  She felt mildly nauseous.  The symptoms feel similar to her prior heart attack, which she had years ago.  She denies any fevers or chills.  Denies any drug use.  She feels improved after receiving nitroglycerin in route.  No other complaints.        Past Medical History:  Diagnosis Date  . COPD (chronic obstructive pulmonary disease) (HCC)   . Hypertension     Patient Active Problem List   Diagnosis Date Noted  . Chest pain 03/19/2020    Past Surgical History:  Procedure Laterality Date  . VASCULAR SURGERY      Prior to Admission medications   Medication Sig Start Date End Date Taking? Authorizing Provider  phentermine (ADIPEX-P) 37.5 MG tablet Take 37.5 mg by mouth every morning. 02/19/20  Yes [provider]  simvastatin (ZOCOR) 20 MG tablet Take 20 mg by mouth at bedtime. 03/19/20  Yes [provider]    Allergies Penicillins  History reviewed. No pertinent family history.  Social History Social History   Tobacco Use  . Smoking status: Current Every Day Smoker    Types: Cigarettes  . Smokeless tobacco: Never Used  . Tobacco comment: 3-4 cig/day  Substance Use Topics  . Drug use: Never    Review of Systems  Review of Systems  Constitutional: Positive for  diaphoresis and fatigue. Negative for fever.  HENT: Negative for congestion and sore throat.   Eyes: Negative for visual disturbance.  Respiratory: Positive for chest tightness and shortness of breath. Negative for cough.   Cardiovascular: Positive for chest pain.  Gastrointestinal: Negative for abdominal pain, diarrhea, nausea and vomiting.  Genitourinary: Negative for flank pain.  Musculoskeletal: Negative for back pain and neck pain.  Skin: Negative for rash and wound.  Neurological: Negative for weakness.  All other systems reviewed and are negative.    ____________________________________________  PHYSICAL EXAM:      VITAL SIGNS: ED Triage Vitals  Enc Vitals Group     BP 03/19/20 1952 (!) 155/79     Pulse Rate 03/19/20 1952 76     Resp 03/19/20 1952 20     Temp 03/19/20 1952 98.4 F (36.9 C)     Temp Source 03/19/20 1952 Oral     SpO2 03/19/20 1952 98 %     Weight 03/19/20 1953 158 lb (71.7 kg)     Height 03/19/20 1953 5\' 5"  (1.651 m)     Head Circumference --      Peak Flow --      Pain Score 03/19/20 1950 6     Pain Loc --      Pain Edu? --      Excl. in GC? --      Physical Exam Vitals and nursing note reviewed.  Constitutional:  General: She is not in acute distress.    Appearance: She is well-developed.  HENT:     Head: Normocephalic and atraumatic.  Eyes:     Conjunctiva/sclera: Conjunctivae normal.  Cardiovascular:     Rate and Rhythm: Normal rate and regular rhythm.     Heart sounds: Normal heart sounds. No murmur heard. No friction rub.  Pulmonary:     Effort: Pulmonary effort is normal. No respiratory distress.     Breath sounds: Normal breath sounds. No wheezing or rales.  Abdominal:     General: There is no distension.     Palpations: Abdomen is soft.     Tenderness: There is no abdominal tenderness.  Musculoskeletal:     Cervical back: Neck supple.  Skin:    General: Skin is warm.     Capillary Refill: Capillary refill takes less  than 2 seconds.  Neurological:     Mental Status: She is alert and oriented to person, place, and time.     Motor: No abnormal muscle tone.       ____________________________________________   LABS (all labs ordered are listed, but only abnormal results are displayed)  Labs Reviewed  CBC WITH DIFFERENTIAL/PLATELET - Abnormal; Notable for the following components:      Result Value   Hemoglobin 11.9 (*)    All other components within normal limits  COMPREHENSIVE METABOLIC PANEL - Abnormal; Notable for the following components:   Glucose, Bld 139 (*)    BUN 34 (*)    Creatinine, Ser 1.01 (*)    All other components within normal limits  CBG MONITORING, ED - Abnormal; Notable for the following components:   Glucose-Capillary 145 (*)    All other components within normal limits  TROPONIN I (HIGH SENSITIVITY) - Abnormal; Notable for the following components:   Troponin I (High Sensitivity) 29 (*)    All other components within normal limits  SARS CORONAVIRUS 2 (TAT 6-24 HRS)  APTT  PROTIME-INR  HEPARIN LEVEL (UNFRACTIONATED)  CBC  HIV ANTIBODY (ROUTINE TESTING W REFLEX)  BASIC METABOLIC PANEL  LIPID PANEL  PROTIME-INR  MAGNESIUM  TROPONIN I (HIGH SENSITIVITY)    ____________________________________________  EKG: Normal sinus rhythm, VR 72. QRS 101, QTc 489. No acute ST elevation. Borderline prolonged Qt interval. ________________________________________  RADIOLOGY All imaging, including plain films, CT scans, and ultrasounds, independently reviewed by me, and interpretations confirmed via formal radiology reads.  ED MD interpretation:   CXR: Clear, no active disease  Official radiology report(s): DG Chest Portable 1 View  Result Date: 03/19/2020 CLINICAL DATA:  Sudden onset chest pain radiating to right side, history of coronary artery disease EXAM: PORTABLE CHEST 1 VIEW COMPARISON:  None. FINDINGS: The heart size and mediastinal contours are within normal limits.  Both lungs are clear. The visualized skeletal structures are unremarkable. IMPRESSION: No active disease. Electronically Signed   By: Sharlet Salina M.D.   On: 03/19/2020 20:10    ____________________________________________  PROCEDURES   Procedure(s) performed (including Critical Care):  .Critical Care Performed by: Shaune Pollack, MD Authorized by: Shaune Pollack, MD   Critical care provider statement:    Critical care time (minutes):  35   Critical care time was exclusive of:  Separately billable procedures and treating other patients and teaching time   Critical care was necessary to treat or prevent imminent or life-threatening deterioration of the following conditions:  Cardiac failure, circulatory failure and respiratory failure   Critical care was time spent personally by me on the following  activities:  Development of treatment plan with patient or surrogate, discussions with consultants, evaluation of patient's response to treatment, examination of patient, obtaining history from patient or surrogate, ordering and performing treatments and interventions, ordering and review of laboratory studies, ordering and review of radiographic studies, pulse oximetry, re-evaluation of patient's condition and review of old charts   I assumed direction of critical care for this patient from another provider in my specialty: no   .1-3 Lead EKG Interpretation Performed by: Shaune Pollack, MD Authorized by: Shaune Pollack, MD     Interpretation: normal     ECG rate:  70-90   ECG rate assessment: normal     Rhythm: sinus rhythm     Ectopy: none     Conduction: normal   Comments:     Indication: Chest pain    ____________________________________________  INITIAL IMPRESSION / MDM / ASSESSMENT AND PLAN / ED COURSE  As part of my medical decision making, I reviewed the following data within the electronic MEDICAL RECORD NUMBER Nursing notes reviewed and incorporated, Old chart reviewed,  Notes from prior ED visits, and [place] Controlled Substance Database       *Tenly Trolinger was evaluated in Emergency Department on 03/20/2020 for the symptoms described in the history of present illness. She was evaluated in the context of the global COVID-19 pandemic, which necessitated consideration that the patient might be at risk for infection with the SARS-CoV-2 virus that causes COVID-19. Institutional protocols and algorithms that pertain to the evaluation of patients at risk for COVID-19 are in a state of rapid change based on information released by regulatory bodies including the CDC and federal and state organizations. These policies and algorithms were followed during the patient's care in the ED.  Some ED evaluations and interventions may be delayed as a result of limited staffing during the pandemic.*     Medical Decision Making:  70 yo F here with chest pain. Pt has known CAD and history is concerning for unstable angina. EKG with EMS does show some subtle inferior ST flattening but this has resolved by arrival to ED, and she had excellent response to ntiro. Dr. Darrold Junker was present at bedside on arrival, feels this does not meet STEMI criteria. That being said, history is highly concerning with dynamic EKG changes, so will admit for likely unstable angina vs NSTEMI. ASA, heparin given. CXR clear, no signs of PNA, PTX. Pain is not concerning for dissection. Labs are otherwise reassuring.   ____________________________________________  FINAL CLINICAL IMPRESSION(S) / ED DIAGNOSES  Final diagnoses:  NSTEMI (non-ST elevated myocardial infarction) (HCC)     MEDICATIONS GIVEN DURING THIS VISIT:  Medications  heparin ADULT infusion 100 units/mL (25000 units/240mL) (850 Units/hr Intravenous New Bag/Given 03/19/20 2022)  aspirin chewable tablet 324 mg (324 mg Oral Not Given 03/19/20 2138)    Or  aspirin suppository 300 mg ( Rectal See Alternative 03/19/20 2138)  aspirin EC tablet 81 mg  (has no administration in time range)  nitroGLYCERIN (NITROSTAT) SL tablet 0.4 mg (has no administration in time range)  acetaminophen (TYLENOL) tablet 650 mg (has no administration in time range)  ondansetron (ZOFRAN) injection 4 mg (has no administration in time range)  0.9 %  sodium chloride infusion ( Intravenous New Bag/Given 03/19/20 2153)  atorvastatin (LIPITOR) tablet 80 mg (80 mg Oral Given 03/19/20 2153)  zolpidem (AMBIEN) tablet 5 mg (has no administration in time range)  ALPRAZolam (XANAX) tablet 0.25 mg (has no administration in time range)  metoprolol tartrate (LOPRESSOR)  tablet 12.5 mg (12.5 mg Oral Given 03/19/20 2153)  morphine 2 MG/ML injection 2 mg (has no administration in time range)  LORazepam (ATIVAN) injection 1 mg (1 mg Intravenous Given 03/19/20 2010)  heparin bolus via infusion 4,000 Units (4,000 Units Intravenous Bolus from Bag 03/19/20 2022)  sodium chloride 0.9 % bolus 250 mL (250 mLs Intravenous New Bag/Given 03/20/20 0025)     ED Discharge Orders    None       Note:  This document was prepared using Dragon voice recognition software and may include unintentional dictation errors.   Shaune Pollack, MD 03/20/20 [phone]

## 2020-03-19 NOTE — ED Triage Notes (Signed)
misternal Chest pain with sudden onset while at rest. Reports radiation to R side. Hx of stent placement. Ems called en route for evauation of STEMI. ED MD and cardiologist at bedside.  324 asa 1 spray nitro 50 mcg fentanyl

## 2020-03-19 NOTE — Progress Notes (Signed)
  Called for possible code STEMI.  Patient with history of coronary artery disease, status post coronary stent 10 years ago in [place].  Earlier tonight, the patient developed substernal chest pain.  EMS called brought patient to [facility] emergency room.  Chest pain modestly improved after nitro spray.  ECG reveals normal sinus rhythm, normal ECG, without ST elevation or ischemic ST-T wave changes.  Code STEMI canceled.  Recommend admission, unassigned cardiology consultation, consider heparin drip, possible cardiac catheterization this hospitalization.

## 2020-03-19 NOTE — Progress Notes (Signed)
Stemi Pg for patient. Upon arrival patient being cared for by medical staff. Patient will be held in ED for monitoring. No family followed. Patient requested prayer.

## 2020-03-19 NOTE — H&P (Addendum)
[facility]   PATIENT NAME: Sabrina Barker    MR#:  [id number]  DATE OF BIRTH:  24-Aug-1950  DATE OF ADMISSION:  03/19/2020  PRIMARY CARE PHYSICIAN: Patient, No Pcp Per   Patient is coming from: Home REQUESTING/REFERRING PHYSICIAN: Shaune Pollack, MD  CHIEF COMPLAINT:   Chief Complaint  Patient presents with  . Chest Pain    HISTORY OF PRESENT ILLNESS:  Sabrina Barker is a 70 y.o. Caucasian female with medical history significant for coronary artery disease, hypertension, COPD as well as ongoing tobacco abuse, who presented to the emergency room with acute onset of midsternal chest pain that felt as pressure and heaviness like a horse sitting on her chest and graded 9/10 in severity with associated nausea and diaphoresis as well as dyspnea and palpitations with no radiation. She denied any cough or wheezing or hemoptysis. No leg pain or edema recent travels or surgeries. Known bleeding diathesis. No abdominal pain or melena or bright red blood per rectum. No dysuria, oliguria or hematuria or flank pain. Her pain has been remarkably better after sublingual nitroglycerin.  ED Course: Blood pressure was 155/79 with otherwise normal vital signs when she came to the ER.  Labs revealed borderline potassium of 3.5 with a BUN of 34 and creatinine 1.01 and otherwise unremarkable CMP.  CBC showed hemoglobin of 11.9 hematocrit 36.   EKG as reviewed by me : Showed minimal sinus bradycardia with rate of 59 Imaging: Chest x-ray showed no acute cardiopulmonary disease.  The patient was given IV heparin bolus and drip as well as Ativan 1 g IV.  The patient received 2 sublingual nitroglycerin and 50 mcg of IV fentanyl by EMS with significant improvement of her pain.  Dr. Darrold Junker was notified and and believe that she is not meeting criteria for STEMI.  PAST MEDICAL HISTORY:   Past Medical History:  Diagnosis Date  . COPD (chronic obstructive pulmonary disease) (HCC)   . Hypertension    Coronary artery disease status post PCI and stent status post PCI and stent Ongoing tobacco abuse PAST SURGICAL HISTORY:   Past Surgical History:  Procedure Laterality Date  . VASCULAR SURGERY      SOCIAL HISTORY:   Social History   Tobacco Use  . Smoking status: Current Every Day Smoker    Types: Cigarettes  . Smokeless tobacco: Never Used  . Tobacco comment: 3-4 cig/day  Substance Use Topics  . Alcohol use: Not on file    FAMILY HISTORY:  Positive for coronary artery disease/MI, CVA, diabetes mellitus and hypertension.  DRUG ALLERGIES:   Allergies  Allergen Reactions  . Penicillins Anaphylaxis    REVIEW OF SYSTEMS:   ROS As per history of present illness. All pertinent systems were reviewed above. Constitutional, HEENT, cardiovascular, respiratory, GI, GU, musculoskeletal, neuro, psychiatric, endocrine, integumentary and hematologic systems were reviewed and are otherwise negative/unremarkable except for positive findings mentioned above in the HPI.   MEDICATIONS AT HOME:   Prior to Admission medications   Not on File      VITAL SIGNS:  Blood pressure (!) 105/59, pulse 80, temperature 98.4 F (36.9 C), temperature source Oral, resp. rate 20, height 5\' 5"  (1.651 m), weight 71.7 kg, SpO2 92 %.  PHYSICAL EXAMINATION:  Physical Exam  GENERAL:  70 y.o.-year-old Caucasian female patient lying in the bed with no acute distress.  EYES: Pupils equal, round, reactive to light and accommodation. No scleral icterus. Extraocular muscles intact.  HEENT: Head atraumatic, normocephalic. Oropharynx and nasopharynx  clear.  NECK:  Supple, no jugular venous distention. No thyroid enlargement, no tenderness.  LUNGS: Normal breath sounds bilaterally, no wheezing, rales,rhonchi or crepitation. No use of accessory muscles of respiration.  CARDIOVASCULAR: Regular rate and rhythm, S1, S2 normal. No murmurs, rubs, or gallops.  ABDOMEN: Soft, nondistended, nontender. Bowel sounds  present. No organomegaly or mass.  EXTREMITIES: No pedal edema, cyanosis, or clubbing.  NEUROLOGIC: Cranial nerves II through XII are intact. Muscle strength 5/5 in all extremities. Sensation intact. Gait not checked.  PSYCHIATRIC: The patient is alert and oriented x 3.  Normal affect and good eye contact. SKIN: No obvious rash, lesion, or ulcer.   LABORATORY PANEL:   CBC Recent Labs  Lab 03/19/20 1952  WBC 8.8  HGB 11.9*  HCT 36.0  PLT 209   ------------------------------------------------------------------------------------------------------------------  Chemistries  Recent Labs  Lab 03/19/20 1952  NA 137  K 3.5  CL 100  CO2 25  GLUCOSE 139*  BUN 34*  CREATININE 1.01*  CALCIUM 9.5  AST 22  ALT 13  ALKPHOS 70  BILITOT 0.5   ------------------------------------------------------------------------------------------------------------------  Cardiac Enzymes No results for input(s): TROPONINI in the last 168 hours. ------------------------------------------------------------------------------------------------------------------  RADIOLOGY:  DG Chest Portable 1 View  Result Date: 03/19/2020 CLINICAL DATA:  Sudden onset chest pain radiating to right side, history of coronary artery disease EXAM: PORTABLE CHEST 1 VIEW COMPARISON:  None. FINDINGS: The heart size and mediastinal contours are within normal limits. Both lungs are clear. The visualized skeletal structures are unremarkable. IMPRESSION: No active disease. Electronically Signed   By: Sharlet Salina M.D.   On: 03/19/2020 20:10      IMPRESSION AND PLAN:  Active Problems:   Chest pain  1.  Chest pain concerning for unstable angina/acute coronary syndrome history of coronary artery disease status post PCI and stent.. -The patient will be admitted to an observation progressive unit bed. -We will follow serial troponin I's. -She will be placed on as needed sublingual nitroglycerin and morphine sulfate for  pain. -We will continue her IV heparin drip per protocol. -We will continue on aspirin as well as high-dose statin therapy. -Cardiology consult will be obtained. -Dr. Lady Gary was notified about the patient.  2.  Anxiety. -We will continue Klonopin.  3.  Dyslipidemia. -We will continue high-dose statin therapy and check lipid profile.  4.  COPD without exacerbation. -She will be placed on DuoNebs.  5.  Essential hypertension. -Blood pressure has been fairly controlled.  DVT prophylaxis: IV heparin drip. Code Status: full code. Family Communication:  The plan of care was discussed in details with the patient (and family). I answered all questions. The patient agreed to proceed with the above mentioned plan. Further management will depend upon hospital course. Disposition Plan: Back to previous home environment Consults called: Cardiology consult Dr. Lady Gary. All the records are reviewed and case discussed with ED provider.  Status is: Observation  The patient remains OBS appropriate and will d/c before 2 midnights.  Dispo: The patient is from: Home              Anticipated d/c is to: Home              Anticipated d/c date is: 1 day              Patient currently is not medically stable to d/c.   Difficult to place patient No   TOTAL TIME TAKING CARE OF THIS PATIENT: 55 minutes.    Hannah Beat M.D on 03/19/2020  at 9:22 PM  [facility]   From 7 PM-7 AM, contact night-coverage [email or web address]  CC: Primary care physician; Patient, No Pcp Per

## 2020-03-19 NOTE — Consult Note (Addendum)
ANTICOAGULATION CONSULT NOTE - Initial Consult  Pharmacy Consult for heparin Indication: chest pain/ACS  Allergies  Allergen Reactions  . Penicillins Anaphylaxis    Patient Measurements: Height: 5\' 5"  (165.1 cm) Weight: 71.7 kg (158 lb) IBW/kg (Calculated) : 57 Heparin Dosing Weight: 71.7  Vital Signs: Temp: 98.4 F (36.9 C) (02/10 1952) Temp Source: Oral (02/10 1952) BP: 155/79 (02/10 1952) Pulse Rate: 76 (02/10 1952)  Labs: Recent Labs    03/19/20 1952  HGB 11.9*  HCT 36.0  PLT 209    CrCl cannot be calculated (No successful lab value found.).   Medical History: Past Medical History:  Diagnosis Date  . COPD (chronic obstructive pulmonary disease) (HCC)   . Hypertension     Medications:  No anticoagulants prior to admission - confirmed with pt by nursing  Assessment: 70 yo F presenting with radiating chest pain. PMH includes CAD s/p stent placement 10 yrs ago. May undergo cardiac cath. Pharmacy consulted to dose heparin for chest pain.   Baseline labs ordered. Hgb 11.9, plts 209.   Goal of Therapy:  Heparin level 0.3-0.7 units/ml Monitor platelets by anticoagulation protocol: Yes   Plan:  Give 4000 units bolus x 1 Start heparin infusion at 850 units/hr Check anti-Xa level in 6 hours and daily while on heparin Continue to monitor H&H and platelets  74, PharmD Pharmacy Resident  03/19/2020 8:13 PM

## 2020-03-20 ENCOUNTER — Ambulatory Visit: Payer: 59 | Admitting: Family Medicine

## 2020-03-20 ENCOUNTER — Encounter
Admission: EM | Disposition: A | Discharge status: Home / Self Care | Payer: Self-pay | Source: Home / Self Care | Attending: Internal Medicine

## 2020-03-20 DIAGNOSIS — J449 Chronic obstructive pulmonary disease, unspecified: Secondary | ICD-10-CM

## 2020-03-20 DIAGNOSIS — Z8249 Family history of ischemic heart disease and other diseases of the circulatory system: Secondary | ICD-10-CM | POA: Diagnosis not present

## 2020-03-20 DIAGNOSIS — Z20822 Contact with and (suspected) exposure to covid-19: Secondary | ICD-10-CM | POA: Diagnosis present

## 2020-03-20 DIAGNOSIS — I10 Essential (primary) hypertension: Secondary | ICD-10-CM

## 2020-03-20 DIAGNOSIS — I214 Non-ST elevation (NSTEMI) myocardial infarction: Secondary | ICD-10-CM | POA: Diagnosis present

## 2020-03-20 DIAGNOSIS — G4440 Drug-induced headache, not elsewhere classified, not intractable: Secondary | ICD-10-CM | POA: Diagnosis not present

## 2020-03-20 DIAGNOSIS — I2510 Atherosclerotic heart disease of native coronary artery without angina pectoris: Secondary | ICD-10-CM

## 2020-03-20 DIAGNOSIS — Z79899 Other long term (current) drug therapy: Secondary | ICD-10-CM | POA: Diagnosis not present

## 2020-03-20 DIAGNOSIS — R339 Retention of urine, unspecified: Secondary | ICD-10-CM | POA: Diagnosis present

## 2020-03-20 DIAGNOSIS — J431 Panlobular emphysema: Secondary | ICD-10-CM | POA: Diagnosis not present

## 2020-03-20 DIAGNOSIS — T463X5A Adverse effect of coronary vasodilators, initial encounter: Secondary | ICD-10-CM | POA: Diagnosis not present

## 2020-03-20 DIAGNOSIS — Z87892 Personal history of anaphylaxis: Secondary | ICD-10-CM | POA: Diagnosis not present

## 2020-03-20 DIAGNOSIS — E785 Hyperlipidemia, unspecified: Secondary | ICD-10-CM | POA: Diagnosis present

## 2020-03-20 DIAGNOSIS — Z880 Allergy status to penicillin: Secondary | ICD-10-CM | POA: Diagnosis not present

## 2020-03-20 DIAGNOSIS — F419 Anxiety disorder, unspecified: Secondary | ICD-10-CM | POA: Diagnosis present

## 2020-03-20 DIAGNOSIS — I252 Old myocardial infarction: Secondary | ICD-10-CM | POA: Diagnosis not present

## 2020-03-20 DIAGNOSIS — F17210 Nicotine dependence, cigarettes, uncomplicated: Secondary | ICD-10-CM | POA: Diagnosis present

## 2020-03-20 DIAGNOSIS — Z7982 Long term (current) use of aspirin: Secondary | ICD-10-CM | POA: Diagnosis not present

## 2020-03-20 HISTORY — PX: LEFT HEART CATH AND CORONARY ANGIOGRAPHY: CATH118249

## 2020-03-20 HISTORY — PX: CORONARY STENT INTERVENTION: CATH118234

## 2020-03-20 HISTORY — DX: Non-ST elevation (NSTEMI) myocardial infarction: I21.4

## 2020-03-20 LAB — CBC
HCT: 32.4 % — ABNORMAL LOW (ref 36.0–46.0)
HCT: 35.7 % — ABNORMAL LOW (ref 36.0–46.0)
Hemoglobin: 10.8 g/dL — ABNORMAL LOW (ref 12.0–15.0)
Hemoglobin: 11.8 g/dL — ABNORMAL LOW (ref 12.0–15.0)
MCH: 30.2 pg (ref 26.0–34.0)
MCH: 29.9 pg (ref 26.0–34.0)
MCHC: 33.3 g/dL (ref 30.0–36.0)
MCHC: 33.1 g/dL (ref 30.0–36.0)
MCV: 90.5 fL (ref 80.0–100.0)
MCV: 90.4 fL (ref 80.0–100.0)
Platelets: 160 10*3/uL (ref 150–400)
Platelets: 184 10*3/uL (ref 150–400)
RBC: 3.58 MIL/uL — ABNORMAL LOW (ref 3.87–5.11)
RBC: 3.95 MIL/uL (ref 3.87–5.11)
RDW: 12.2 % (ref 11.5–15.5)
RDW: 12 % (ref 11.5–15.5)
WBC: 7.2 10*3/uL (ref 4.0–10.5)
WBC: 9.3 10*3/uL (ref 4.0–10.5)
nRBC: 0 % (ref 0.0–0.2)
nRBC: 0 % (ref 0.0–0.2)

## 2020-03-20 LAB — BASIC METABOLIC PANEL
Anion gap: 9 (ref 5–15)
Anion gap: 11 (ref 5–15)
BUN: 26 mg/dL — ABNORMAL HIGH (ref 8–23)
BUN: 21 mg/dL (ref 8–23)
CO2: 19 mmol/L — ABNORMAL LOW (ref 22–32)
CO2: 23 mmol/L (ref 22–32)
Calcium: 7.8 mg/dL — ABNORMAL LOW (ref 8.9–10.3)
Calcium: 9.2 mg/dL (ref 8.9–10.3)
Chloride: 112 mmol/L — ABNORMAL HIGH (ref 98–111)
Chloride: 104 mmol/L (ref 98–111)
Creatinine, Ser: 0.85 mg/dL (ref 0.44–1.00)
Creatinine, Ser: 0.7 mg/dL (ref 0.44–1.00)
GFR, Estimated: >60 mL/min (ref >60)
GFR, Estimated: >60 mL/min (ref >60)
Glucose, Bld: 88 mg/dL (ref 70–99)
Glucose, Bld: 106 mg/dL — ABNORMAL HIGH (ref 70–99)
Potassium: 3.8 mmol/L (ref 3.5–5.1)
Potassium: 3.3 mmol/L — ABNORMAL LOW (ref 3.5–5.1)
Sodium: 140 mmol/L (ref 135–145)
Sodium: 138 mmol/L (ref 135–145)

## 2020-03-20 LAB — LIPID PANEL
Cholesterol: 139 mg/dL (ref 0–200)
HDL: 37 mg/dL — ABNORMAL LOW (ref >40)
LDL Cholesterol: 80 mg/dL (ref 0–99)
Total CHOL/HDL Ratio: 3.8 RATIO
Triglycerides: 111 mg/dL (ref <150)
VLDL: 22 mg/dL (ref 0–40)

## 2020-03-20 LAB — BRAIN NATRIURETIC PEPTIDE: B Natriuretic Peptide: 20.9 pg/mL (ref 0.0–100.0)

## 2020-03-20 LAB — PROTIME-INR
INR: 1.1 (ref 0.8–1.2)
INR: 1.5 — ABNORMAL HIGH (ref 0.8–1.2)
Prothrombin Time: 13.9 seconds (ref 11.4–15.2)
Prothrombin Time: 17.6 seconds — ABNORMAL HIGH (ref 11.4–15.2)

## 2020-03-20 LAB — SARS CORONAVIRUS 2 (TAT 6-24 HRS): SARS Coronavirus 2: NEGATIVE

## 2020-03-20 LAB — POCT ACTIVATED CLOTTING TIME: Activated Clotting Time: 369 seconds

## 2020-03-20 LAB — TROPONIN I (HIGH SENSITIVITY)
Troponin I (High Sensitivity): 29 ng/L — ABNORMAL HIGH (ref <18)
Troponin I (High Sensitivity): 152 ng/L (ref <18)
Troponin I (High Sensitivity): 110 ng/L (ref <18)

## 2020-03-20 LAB — HEPARIN LEVEL (UNFRACTIONATED)
Heparin Unfractionated: 0.33 IU/mL (ref 0.30–0.70)
Heparin Unfractionated: 0.25 IU/mL — ABNORMAL LOW (ref 0.30–0.70)

## 2020-03-20 LAB — MAGNESIUM: Magnesium: 2.1 mg/dL (ref 1.7–2.4)

## 2020-03-20 LAB — HIV ANTIBODY (ROUTINE TESTING W REFLEX): HIV Screen 4th Generation wRfx: NONREACTIVE

## 2020-03-20 LAB — TSH: TSH: 0.507 u[IU]/mL (ref 0.350–4.500)

## 2020-03-20 SURGERY — LEFT HEART CATH AND CORONARY ANGIOGRAPHY
Anesthesia: Moderate Sedation

## 2020-03-20 MED ORDER — CHLORHEXIDINE GLUCONATE CLOTH 2 % EX PADS: 6.0000 | MEDICATED_PAD | Freq: Every day | CUTANEOUS | Status: DC

## 2020-03-20 MED ORDER — MIDAZOLAM HCL 2 MG/2ML IJ SOLN
INTRAMUSCULAR | Status: AC
  Filled 2020-03-20: qty 2

## 2020-03-20 MED ORDER — SODIUM CHLORIDE 0.9 % IV SOLN: 250.0000 mL | INTRAVENOUS | Status: DC | PRN

## 2020-03-20 MED ORDER — MIDAZOLAM HCL 2 MG/2ML IJ SOLN
INTRAMUSCULAR | Status: DC | PRN
  Administered 2020-03-20: 1 mg via INTRAVENOUS
  Administered 2020-03-20: 2 mg via INTRAVENOUS

## 2020-03-20 MED ORDER — SODIUM CHLORIDE 0.9% FLUSH: 3.0000 mL | Freq: Two times a day (BID) | INTRAVENOUS | Status: DC

## 2020-03-20 MED ORDER — TICAGRELOR 90 MG PO TABS
ORAL_TABLET | ORAL | Status: AC
  Filled 2020-03-20: qty 2

## 2020-03-20 MED ORDER — ENOXAPARIN SODIUM 40 MG/0.4ML ~~LOC~~ SOLN: 40.0000 mg | SUBCUTANEOUS | Status: DC

## 2020-03-20 MED ORDER — HYDRALAZINE HCL 20 MG/ML IJ SOLN: 10.0000 mg | INTRAMUSCULAR | Status: AC | PRN

## 2020-03-20 MED ORDER — SODIUM CHLORIDE 0.9 % WEIGHT BASED INFUSION: 1.0000 mL/kg/h | INTRAVENOUS | Status: DC

## 2020-03-20 MED ORDER — SODIUM CHLORIDE 0.9 % WEIGHT BASED INFUSION: 1.0000 mL/kg/h | INTRAVENOUS | Status: AC

## 2020-03-20 MED ORDER — IOHEXOL 300 MG/ML  SOLN
INTRAMUSCULAR | Status: DC | PRN
  Administered 2020-03-20: 60 mL

## 2020-03-20 MED ORDER — MORPHINE SULFATE (PF) 4 MG/ML IV SOLN
2.0000 mg | INTRAVENOUS | Status: DC | PRN
  Administered 2020-03-20: 2 mg via INTRAVENOUS

## 2020-03-20 MED ORDER — BIVALIRUDIN BOLUS VIA INFUSION - CUPID
INTRAVENOUS | Status: DC | PRN
  Administered 2020-03-20: 53.775 mg via INTRAVENOUS

## 2020-03-20 MED ORDER — SODIUM CHLORIDE 0.9 % IV SOLN: INTRAVENOUS | Status: AC

## 2020-03-20 MED ORDER — ONDANSETRON HCL 4 MG/2ML IJ SOLN: 4.0000 mg | Freq: Four times a day (QID) | INTRAMUSCULAR | Status: DC | PRN

## 2020-03-20 MED ORDER — LABETALOL HCL 5 MG/ML IV SOLN: 10.0000 mg | INTRAVENOUS | Status: AC | PRN

## 2020-03-20 MED ORDER — SODIUM CHLORIDE 0.9 % IV BOLUS
250.0000 mL | Freq: Once | INTRAVENOUS | Status: AC
  Administered 2020-03-20: 250 mL via INTRAVENOUS

## 2020-03-20 MED ORDER — SODIUM CHLORIDE 0.9% FLUSH
3.0000 mL | Freq: Two times a day (BID) | INTRAVENOUS | Status: DC
  Administered 2020-03-20 (×2): 3 mL via INTRAVENOUS

## 2020-03-20 MED ORDER — SODIUM CHLORIDE 0.9% FLUSH: 3.0000 mL | INTRAVENOUS | Status: DC | PRN

## 2020-03-20 MED ORDER — HEPARIN (PORCINE) IN NACL 1000-0.9 UT/500ML-% IV SOLN
INTRAVENOUS | Status: DC | PRN
  Administered 2020-03-20: 500 mL

## 2020-03-20 MED ORDER — ACETAMINOPHEN 325 MG PO TABS
ORAL_TABLET | ORAL | Status: AC
  Filled 2020-03-20: qty 2

## 2020-03-20 MED ORDER — HYDRALAZINE HCL 20 MG/ML IJ SOLN
INTRAMUSCULAR | Status: DC | PRN
  Administered 2020-03-20: 10 mg via INTRAVENOUS

## 2020-03-20 MED ORDER — MIDAZOLAM HCL 2 MG/2ML IJ SOLN
INTRAMUSCULAR | Status: DC | PRN
  Administered 2020-03-20: 1 mg via INTRAVENOUS

## 2020-03-20 MED ORDER — NITROGLYCERIN 1 MG/10 ML FOR IR/CATH LAB
INTRA_ARTERIAL | Status: DC | PRN
  Administered 2020-03-20 (×3): 200 ug via INTRACORONARY

## 2020-03-20 MED ORDER — HEPARIN BOLUS VIA INFUSION
1100.0000 [IU] | Freq: Once | INTRAVENOUS | Status: AC
  Administered 2020-03-20: 1100 [IU] via INTRAVENOUS
  Filled 2020-03-20: qty 1100

## 2020-03-20 MED ORDER — FENTANYL CITRATE (PF) 100 MCG/2ML IJ SOLN
INTRAMUSCULAR | Status: DC | PRN
  Administered 2020-03-20: 25 ug via INTRAVENOUS

## 2020-03-20 MED ORDER — LIDOCAINE HCL (PF) 1 % IJ SOLN
INTRAMUSCULAR | Status: AC
  Filled 2020-03-20: qty 30

## 2020-03-20 MED ORDER — FENTANYL CITRATE (PF) 100 MCG/2ML IJ SOLN
INTRAMUSCULAR | Status: AC
  Filled 2020-03-20: qty 2

## 2020-03-20 MED ORDER — SODIUM CHLORIDE 0.9 % WEIGHT BASED INFUSION: 3.0000 mL/kg/h | INTRAVENOUS | Status: DC

## 2020-03-20 MED ORDER — TICAGRELOR 90 MG PO TABS
90.0000 mg | ORAL_TABLET | Freq: Two times a day (BID) | ORAL | Status: DC
  Administered 2020-03-20 – 2020-03-21 (×2): 90 mg via ORAL
  Filled 2020-03-20 (×2): qty 1

## 2020-03-20 MED ORDER — HYDRALAZINE HCL 20 MG/ML IJ SOLN
INTRAMUSCULAR | Status: AC
  Filled 2020-03-20: qty 1

## 2020-03-20 MED ORDER — IOHEXOL 300 MG/ML  SOLN
INTRAMUSCULAR | Status: DC | PRN
  Administered 2020-03-20: 40 mL via INTRA_ARTERIAL

## 2020-03-20 MED ORDER — SODIUM CHLORIDE 0.9 % IV SOLN
INTRAVENOUS | Status: DC | PRN
  Administered 2020-03-20: 1.75 mg/kg/h via INTRAVENOUS

## 2020-03-20 MED ORDER — BIVALIRUDIN TRIFLUOROACETATE 250 MG IV SOLR
INTRAVENOUS | Status: AC
  Filled 2020-03-20: qty 250

## 2020-03-20 MED ORDER — TICAGRELOR 90 MG PO TABS
ORAL_TABLET | ORAL | Status: DC | PRN
  Administered 2020-03-20: 180 mg via ORAL

## 2020-03-20 MED ORDER — ASPIRIN 81 MG PO CHEW: 81.0000 mg | CHEWABLE_TABLET | ORAL | Status: DC

## 2020-03-20 MED ORDER — FENTANYL CITRATE (PF) 100 MCG/2ML IJ SOLN
INTRAMUSCULAR | Status: DC | PRN
  Administered 2020-03-20: 50 ug via INTRAVENOUS
  Administered 2020-03-20: 25 ug via INTRAVENOUS

## 2020-03-20 MED ORDER — LIDOCAINE HCL (PF) 1 % IJ SOLN
INTRAMUSCULAR | Status: DC | PRN
  Administered 2020-03-20: 15 mL

## 2020-03-20 MED ORDER — ACETAMINOPHEN 325 MG PO TABS
650.0000 mg | ORAL_TABLET | ORAL | Status: DC | PRN
  Administered 2020-03-20: 650 mg via ORAL

## 2020-03-20 MED ORDER — HEPARIN (PORCINE) IN NACL 1000-0.9 UT/500ML-% IV SOLN
INTRAVENOUS | Status: AC
  Filled 2020-03-20: qty 1000

## 2020-03-20 MED ORDER — MORPHINE SULFATE (PF) 2 MG/ML IV SOLN
INTRAVENOUS | Status: AC
  Filled 2020-03-20: qty 1

## 2020-03-20 SURGICAL SUPPLY — 24 items
BALLN TREK RX 2.25X12 (BALLOONS) ×2
BALLN TREK RX 2.5X8 (BALLOONS) ×2
BALLN ~~LOC~~ TREK RX 2.5X12 (BALLOONS) ×2
BALLN ~~LOC~~ TREK RX 2.75X12 (BALLOONS) ×2
BALLOON TREK RX 2.25X12 (BALLOONS) ×1 IMPLANT
BALLOON TREK RX 2.5X8 (BALLOONS) ×1 IMPLANT
BALLOON ~~LOC~~ TREK RX 2.5X12 (BALLOONS) ×1 IMPLANT
BALLOON ~~LOC~~ TREK RX 2.75X12 (BALLOONS) ×1 IMPLANT
CATH INFINITI 5FR JL4 (CATHETERS) ×2 IMPLANT
CATH INFINITI JR4 5F (CATHETERS) ×2 IMPLANT
CATH LAUNCHER 6FR JR4 (CATHETERS) ×2 IMPLANT
DEVICE CLOSURE MYNXGRIP 6/7F (Vascular Products) ×2 IMPLANT
KIT ENCORE 26 ADVANTAGE (KITS) ×4 IMPLANT
KIT MANI 3VAL PERCEP (MISCELLANEOUS) ×2 IMPLANT
NEEDLE PERC 18GX7CM (NEEDLE) ×2 IMPLANT
PACK CARDIAC CATH (CUSTOM PROCEDURE TRAY) ×2 IMPLANT
SHEATH AVANTI 5FR X 11CM (SHEATH) ×2 IMPLANT
SHEATH AVANTI 6FR X 11CM (SHEATH) ×2 IMPLANT
STENT RESOLUTE ONYX 2.75X12 (Permanent Stent) ×2 IMPLANT
WIRE ASAHI GRAND SLAM 180CM (WIRE) ×2 IMPLANT
WIRE G INSERTION TOOL (WIRE) ×2 IMPLANT
WIRE GUIDERIGHT .035X150 (WIRE) ×2 IMPLANT
WIRE HI TORQ WHISPER MS 190CM (WIRE) ×2 IMPLANT
WIRE RUNTHROUGH .014X180CM (WIRE) ×2 IMPLANT

## 2020-03-20 NOTE — ED Notes (Signed)
Date and time results received: 03/20/20 0017  Test: Troponin Critical Value: 29  Name of Provider Notified: Dr. Arville Care and NP Jon Billings  Orders Received? Or Actions Taken?: Awaiting response

## 2020-03-20 NOTE — Progress Notes (Signed)
Foley catheter discontinued per Dr. Chipper Herb

## 2020-03-20 NOTE — Consult Note (Signed)
Sabrina Barker is a 70 y.o. female  [id number]  Primary Cardiologist: Adrian Blackwater Reason for Consultation: Non-STEMI  HPI: This is a 70 year old white female who had stents placed after myocardial infarction 10 years ago in [place] presented to the hospital with chest pain shortness of breath and palpitation.   Review of Systems: No orthopnea PND or leg swelling   Past Medical History:  Diagnosis Date  . COPD (chronic obstructive pulmonary disease) (HCC)   . Hypertension     (Not in a hospital admission)    . aspirin  324 mg Oral NOW   Or  . aspirin  300 mg Rectal NOW  . aspirin EC  81 mg Oral Daily  . atorvastatin  80 mg Oral Daily  . metoprolol tartrate  12.5 mg Oral BID  . sodium chloride flush  3 mL Intravenous Q12H    Infusions: . sodium chloride 100 mL/hr at 03/20/20 0732  . heparin 850 Units/hr (03/20/20 0732)    Allergies  Allergen Reactions  . Penicillins Anaphylaxis    Social History   Socioeconomic History  . Marital status: Widowed    Spouse name: Not on file  . Number of children: Not on file  . Years of education: Not on file  . Highest education level: Not on file  Occupational History  . Not on file  Tobacco Use  . Smoking status: Current Every Day Smoker    Types: Cigarettes  . Smokeless tobacco: Never Used  . Tobacco comment: 3-4 cig/day  Substance and Sexual Activity  . Alcohol use: Not on file  . Drug use: Never  . Sexual activity: Not on file  Other Topics Concern  . Not on file  Social History Narrative  . Not on file   Social Determinants of Health   Financial Resource Strain: Not on file  Food Insecurity: Not on file  Transportation Needs: Not on file  Physical Activity: Not on file  Stress: Not on file  Social Connections: Not on file  Intimate Partner Violence: Not on file    History reviewed. No pertinent family history.  PHYSICAL EXAM: Vitals:   03/20/20 0700 03/20/20 0730  BP: 113/64 (!) 121/59  Pulse:  (!) 48 (!) 50  Resp:    Temp:    SpO2: 97% 99%     Intake/Output Summary (Last 24 hours) at 03/20/2020 0254 Last data filed at 03/20/2020 0200 Gross per 24 hour  Intake 250 ml  Output --  Net 250 ml    General:  Well appearing. No respiratory difficulty HEENT: normal Neck: supple. no JVD. Carotids 2+ bilat; no bruits. No lymphadenopathy or thryomegaly appreciated. Cor: PMI nondisplaced. Regular rate & rhythm. No rubs, gallops or murmurs. Lungs: clear Abdomen: soft, nontender, nondistended. No hepatosplenomegaly. No bruits or masses. Good bowel sounds. Extremities: no cyanosis, clubbing, rash, edema Neuro: alert & oriented x 3, cranial nerves grossly intact. moves all 4 extremities w/o difficulty. Affect pleasant.  ECG: Sinus bradycardia with no acute changes  Results for orders placed or performed during the hospital encounter of 03/19/20 (from the past 24 hour(s))  CBG monitoring, ED     Status: Abnormal   Collection Time: 03/19/20  7:52 PM  Result Value Ref Range   Glucose-Capillary 145 (H) 70 - 99 mg/dL  CBC with Differential     Status: Abnormal   Collection Time: 03/19/20  7:52 PM  Result Value Ref Range   WBC 8.8 4.0 - 10.5 K/uL   RBC 4.02 3.87 -  5.11 MIL/uL   Hemoglobin 11.9 (L) 12.0 - 15.0 g/dL   HCT 81.0 17.5 - 10.2 %   MCV 89.6 80.0 - 100.0 fL   MCH 29.6 26.0 - 34.0 pg   MCHC 33.1 30.0 - 36.0 g/dL   RDW 58.5 27.7 - 82.4 %   Platelets 209 150 - 400 K/uL   nRBC 0.0 0.0 - 0.2 %   Neutrophils Relative % 59 %   Neutro Abs 5.2 1.7 - 7.7 K/uL   Lymphocytes Relative 32 %   Lymphs Abs 2.8 0.7 - 4.0 K/uL   Monocytes Relative 6 %   Monocytes Absolute 0.5 0.1 - 1.0 K/uL   Eosinophils Relative 2 %   Eosinophils Absolute 0.2 0.0 - 0.5 K/uL   Basophils Relative 1 %   Basophils Absolute 0.1 0.0 - 0.1 K/uL   Immature Granulocytes 0 %   Abs Immature Granulocytes 0.02 0.00 - 0.07 K/uL  Comprehensive metabolic panel     Status: Abnormal   Collection Time: 03/19/20  7:52  PM  Result Value Ref Range   Sodium 137 135 - 145 mmol/L   Potassium 3.5 3.5 - 5.1 mmol/L   Chloride 100 98 - 111 mmol/L   CO2 25 22 - 32 mmol/L   Glucose, Bld 139 (H) 70 - 99 mg/dL   BUN 34 (H) 8 - 23 mg/dL   Creatinine, Ser 2.35 (H) 0.44 - 1.00 mg/dL   Calcium 9.5 8.9 - 36.1 mg/dL   Total Protein 7.2 6.5 - 8.1 g/dL   Albumin 4.2 3.5 - 5.0 g/dL   AST 22 15 - 41 U/L   ALT 13 0 - 44 U/L   Alkaline Phosphatase 70 38 - 126 U/L   Total Bilirubin 0.5 0.3 - 1.2 mg/dL   GFR, Estimated >44 >31 mL/min   Anion gap 12 5 - 15  Troponin I (High Sensitivity)     Status: None   Collection Time: 03/19/20  7:52 PM  Result Value Ref Range   Troponin I (High Sensitivity) 5 <18 ng/L  APTT     Status: None   Collection Time: 03/19/20  7:52 PM  Result Value Ref Range   aPTT 30 24 - 36 seconds  Protime-INR     Status: None   Collection Time: 03/19/20  7:52 PM  Result Value Ref Range   Prothrombin Time 12.9 11.4 - 15.2 seconds   INR 1.0 0.8 - 1.2  Troponin I (High Sensitivity)     Status: Abnormal   Collection Time: 03/19/20  9:50 PM  Result Value Ref Range   Troponin I (High Sensitivity) 29 (H) <18 ng/L  Magnesium     Status: None   Collection Time: 03/19/20  9:50 PM  Result Value Ref Range   Magnesium 2.1 1.7 - 2.4 mg/dL  CBC     Status: Abnormal   Collection Time: 03/20/20  2:27 AM  Result Value Ref Range   WBC 7.2 4.0 - 10.5 K/uL   RBC 3.58 (L) 3.87 - 5.11 MIL/uL   Hemoglobin 10.8 (L) 12.0 - 15.0 g/dL   HCT 54.0 (L) 08.6 - 76.1 %   MCV 90.5 80.0 - 100.0 fL   MCH 30.2 26.0 - 34.0 pg   MCHC 33.3 30.0 - 36.0 g/dL   RDW 95.0 93.2 - 67.1 %   Platelets 160 150 - 400 K/uL   nRBC 0.0 0.0 - 0.2 %  Basic metabolic panel     Status: Abnormal   Collection Time: 03/20/20  2:27 AM  Result Value Ref Range   Sodium 140 135 - 145 mmol/L   Potassium 3.8 3.5 - 5.1 mmol/L   Chloride 112 (H) 98 - 111 mmol/L   CO2 19 (L) 22 - 32 mmol/L   Glucose, Bld 88 70 - 99 mg/dL   BUN 26 (H) 8 - 23 mg/dL    Creatinine, Ser 2.29 0.44 - 1.00 mg/dL   Calcium 7.8 (L) 8.9 - 10.3 mg/dL   GFR, Estimated >79 >89 mL/min   Anion gap 9 5 - 15  Lipid panel     Status: Abnormal   Collection Time: 03/20/20  2:27 AM  Result Value Ref Range   Cholesterol 139 0 - 200 mg/dL   Triglycerides 211 <941 mg/dL   HDL 37 (L) >74 mg/dL   Total CHOL/HDL Ratio 3.8 RATIO   VLDL 22 0 - 40 mg/dL   LDL Cholesterol 80 0 - 99 mg/dL  Protime-INR     Status: None   Collection Time: 03/20/20  2:27 AM  Result Value Ref Range   Prothrombin Time 13.9 11.4 - 15.2 seconds   INR 1.1 0.8 - 1.2  Troponin I (High Sensitivity)     Status: Abnormal   Collection Time: 03/20/20  2:27 AM  Result Value Ref Range   Troponin I (High Sensitivity) 152 (HH) <18 ng/L  Heparin level (unfractionated)     Status: None   Collection Time: 03/20/20  2:27 AM  Result Value Ref Range   Heparin Unfractionated 0.33 0.30 - 0.70 IU/mL  TSH     Status: None   Collection Time: 03/20/20  5:15 AM  Result Value Ref Range   TSH 0.507 0.350 - 4.500 uIU/mL  Brain natriuretic peptide     Status: None   Collection Time: 03/20/20  5:15 AM  Result Value Ref Range   B Natriuretic Peptide 20.9 0.0 - 100.0 pg/mL  Troponin I (High Sensitivity)     Status: Abnormal   Collection Time: 03/20/20  5:15 AM  Result Value Ref Range   Troponin I (High Sensitivity) 110 (HH) <18 ng/L  Heparin level (unfractionated)     Status: Abnormal   Collection Time: 03/20/20  8:27 AM  Result Value Ref Range   Heparin Unfractionated 0.25 (L) 0.30 - 0.70 IU/mL   DG Chest Portable 1 View  Result Date: 03/19/2020 CLINICAL DATA:  Sudden onset chest pain radiating to right side, history of coronary artery disease EXAM: PORTABLE CHEST 1 VIEW COMPARISON:  None. FINDINGS: The heart size and mediastinal contours are within normal limits. Both lungs are clear. The visualized skeletal structures are unremarkable. IMPRESSION: No active disease. Electronically Signed   By: Sharlet Salina M.D.    On: 03/19/2020 20:10     ASSESSMENT AND PLAN: Non-STEMI with severe pressure type chest pain associated with shortness of breath and history of PCI and stenting.  Patient was explained risk and benefits of cardiac catheterization has agreed to the procedure and will be scheduled today.  Royanne Warshaw A

## 2020-03-20 NOTE — ED Notes (Signed)
MD Mansy notified pt EKG completed and in chart for review.

## 2020-03-20 NOTE — ED Notes (Signed)
MD notified of critical troponin and pt hypotension.

## 2020-03-20 NOTE — ED Notes (Signed)
Date and time results received: 03/20/20 0320  Test: Troponin Critical Value: 152  Name of Provider Notified: Manuela Schwartz, NP  Orders Received? Or Actions Taken?: Awaiting response

## 2020-03-20 NOTE — Progress Notes (Signed)
PROGRESS NOTE    Sabrina Barker  LZJ:[id number] DOB: 12-15-1950 DOA: 03/19/2020 PCP: Patient, No Pcp Per   CC: Chest pain. Brief Narrative:  Sabrina Barker is a 70 y.o. Caucasian female with medical history significant for coronary artery disease, hypertension, COPD as well as ongoing tobacco abuse, who presented to the emergency room with acute onset of midsternal chest pain. Peak troponin 152.   Heart cath 2/11 showed 95% stenosis in RPDA, performed PCI and stenting   Assessment & Plan:   Active Problems:   Chest pain  #1.  Non-STEMI. Patient is status post cardiac intervention with heart cath.  Continue antiplatelet treatment, statin.  Patient will be monitored overnight after cardiac cath.  Most likely will be discharged home tomorrow.  #2.  COPD pain stable.  3.  Essential hypertension. Continue home medicines    DVT prophylaxis: Heparin drip Code Status: Full Family Communication:  Disposition Plan:  .   Status is: Observation  The patient will require care spanning > 2 midnights and should be moved to inpatient because: Inpatient level of care appropriate due to severity of illness  Dispo: The patient is from: Home              Anticipated d/c is to: Home              Anticipated d/c date is: 1 day              Patient currently is not medically stable to d/c.   Difficult to place patient No        I/O last 3 completed shifts: In: 250 [IV Piggyback:250] Out: -  Total I/O In: 256.2 [I.V.:256.2] Out: -      Consultants:   Cardiology  Procedures: Heart cath  Antimicrobials: None  Subjective: Patient doing well after the procedure, chest pain has resolved.  No shortness of breath. She is complaining some headache from nitroglycerin.  No nausea vomiting or dizziness. No abdominal pain or nausea vomiting.  Objective: Vitals:   03/20/20 1100 03/20/20 1130 03/20/20 1200 03/20/20 1242  BP: 112/65 (!) 143/62 (!) 141/67 (!) 147/62  Pulse: 60 (!)  48 (!) 58 (!) 48  Resp: 12 12 15 13   Temp:    98.1 F (36.7 C)  TempSrc:    Oral  SpO2: 99% 98% 98% 100%  Weight:      Height:        Intake/Output Summary (Last 24 hours) at 03/20/2020 1435 Last data filed at 03/20/2020 1012 Gross per 24 hour  Intake 506.2 ml  Output --  Net 506.2 ml   Filed Weights   03/19/20 1953  Weight: 71.7 kg    Examination:  General exam: Appears calm and comfortable  Respiratory system: Clear to auscultation. Respiratory effort normal. Cardiovascular system: S1 & S2 heard, RRR. No JVD, murmurs, rubs, gallops or clicks. No pedal edema. Gastrointestinal system: Abdomen is nondistended, soft and nontender. No organomegaly or masses felt. Normal bowel sounds heard. Central nervous system: Alert and oriented. No focal neurological deficits. Extremities: Symmetric 5 x 5 power. Skin: No rashes, lesions or ulcers Psychiatry: Judgement and insight appear normal. Mood & affect appropriate.     Data Reviewed: I have personally reviewed following labs and imaging studies  CBC: Recent Labs  Lab 03/19/20 1952 03/20/20 0227  WBC 8.8 7.2  NEUTROABS 5.2  --   HGB 11.9* 10.8*  HCT 36.0 32.4*  MCV 89.6 90.5  PLT 209 160   Basic Metabolic Panel: Recent Labs  Lab 03/19/20 1952 03/19/20 2150 03/20/20 0227  NA 137  --  140  K 3.5  --  3.8  CL 100  --  112*  CO2 25  --  19*  GLUCOSE 139*  --  88  BUN 34*  --  26*  CREATININE 1.01*  --  0.85  CALCIUM 9.5  --  7.8*  MG  --  2.1  --    GFR: Estimated Creatinine Clearance: 62.9 mL/min (by C-G formula based on SCr of 0.85 mg/dL). Liver Function Tests: Recent Labs  Lab 03/19/20 1952  AST 22  ALT 13  ALKPHOS 70  BILITOT 0.5  PROT 7.2  ALBUMIN 4.2   No results for input(s): LIPASE, AMYLASE in the last 168 hours. No results for input(s): AMMONIA in the last 168 hours. Coagulation Profile: Recent Labs  Lab 03/19/20 1952 03/20/20 0227  INR 1.0 1.1   Cardiac Enzymes: No results for input(s):  CKTOTAL, CKMB, CKMBINDEX, TROPONINI in the last 168 hours. BNP (last 3 results) No results for input(s): PROBNP in the last 8760 hours. HbA1C: No results for input(s): HGBA1C in the last 72 hours. CBG: Recent Labs  Lab 03/19/20 1952  GLUCAP 145*   Lipid Profile: Recent Labs    03/20/20 0227  CHOL 139  HDL 37*  LDLCALC 80  TRIG 166  CHOLHDL 3.8   Thyroid Function Tests: Recent Labs    03/20/20 0515  TSH 0.507   Anemia Panel: No results for input(s): VITAMINB12, FOLATE, FERRITIN, TIBC, IRON, RETICCTPCT in the last 72 hours. Sepsis Labs: No results for input(s): PROCALCITON, LATICACIDVEN in the last 168 hours.  Recent Results (from the past 240 hour(s))  SARS CORONAVIRUS 2 (TAT 6-24 HRS) Nasopharyngeal Nasopharyngeal Swab     Status: None   Collection Time: 03/19/20  9:50 PM   Specimen: Nasopharyngeal Swab  Result Value Ref Range Status   SARS Coronavirus 2 NEGATIVE NEGATIVE Final    Comment: (NOTE) SARS-CoV-2 target nucleic acids are NOT DETECTED.  The SARS-CoV-2 RNA is generally detectable in upper and lower respiratory specimens during the acute phase of infection. Negative results do not preclude SARS-CoV-2 infection, do not rule out co-infections with other pathogens, and should not be used as the sole basis for treatment or other patient management decisions. Negative results must be combined with clinical observations, patient history, and epidemiological information. The expected result is Negative.  Fact Sheet for Patients: HairSlick.no  Fact Sheet for Healthcare Providers: quierodirigir.com  This test is not yet approved or cleared by the [place] FDA and  has been authorized for detection and/or diagnosis of SARS-CoV-2 by FDA under an Emergency Use Authorization (EUA). This EUA will remain  in effect (meaning this test can be used) for the duration of the COVID-19 declaration under Se ction  564(b)(1) of the Act, 21 U.S.C. section 360bbb-3(b)(1), unless the authorization is terminated or revoked sooner.  Performed at [facility], [address] [place], [place] [address]          Radiology Studies: CARDIAC CATHETERIZATION  Result Date: 03/20/2020  Mid RCA lesion is 30% stenosed.  RPDA lesion is 95% stenosed.  Advise PCI and stenting of the PLV branch.  Patient has 90% lesion in the proximal PLV branch of RCA.  Left coronary system has no significant disease and normal ejection fraction.   DG Chest Portable 1 View  Result Date: 03/19/2020 CLINICAL DATA:  Sudden onset chest pain radiating to right side, history of coronary artery disease  EXAM: PORTABLE CHEST 1 VIEW COMPARISON:  None. FINDINGS: The heart size and mediastinal contours are within normal limits. Both lungs are clear. The visualized skeletal structures are unremarkable. IMPRESSION: No active disease. Electronically Signed   By: Sharlet Salina M.D.   On: 03/19/2020 20:10        Scheduled Meds: . [MAR Hold] aspirin  324 mg Oral NOW   Or  . [MAR Hold] aspirin  300 mg Rectal NOW  . [MAR Hold] aspirin EC  81 mg Oral Daily  . [MAR Hold] atorvastatin  80 mg Oral Daily  . [MAR Hold] metoprolol tartrate  12.5 mg Oral BID  . [MAR Hold] sodium chloride flush  3 mL Intravenous Q12H   Continuous Infusions: . sodium chloride 100 mL/hr at 03/20/20 0732  . bivalirudin (ANGIOMAX) infusion 5 mg/mL 1.75 mg/kg/hr (03/20/20 1354)  . heparin Stopped (03/20/20 1243)     LOS: 0 days    Time spent: 27 minutes    Marrion Coy, MD [facility]   To contact the attending provider between 7A-7P or the covering provider during after hours 7P-7A, please log into the web site [email or web address] and access using universal [place] password for that web site. If you do not have the password, please call the hospital operator.  03/20/2020, 2:35 PM

## 2020-03-20 NOTE — Plan of Care (Signed)
  Problem: Education: Goal: Knowledge of General Education information will improve Description: Including pain rating scale, medication(s)/side effects and non-pharmacologic comfort measures Outcome: Progressing   Problem: Health Behavior/Discharge Planning: Goal: Ability to manage health-related needs will improve Outcome: Progressing   Problem: Clinical Measurements: Goal: Will remain free from infection Outcome: Progressing   

## 2020-03-20 NOTE — Consult Note (Signed)
ANTICOAGULATION CONSULT NOTE   Pharmacy Consult for heparin Indication: chest pain/ACS  Allergies  Allergen Reactions   Penicillins Anaphylaxis    Patient Measurements: Height: 5\' 5"  (165.1 cm) Weight: 71.7 kg (158 lb) IBW/kg (Calculated) : 57 Heparin Dosing Weight: 71.7  Vital Signs: Temp: 98.4 F (36.9 C) (02/10 1952) Temp Source: Oral (02/10 1952) BP: 138/56 (02/11 0530) Pulse Rate: 58 (02/11 0530)  Labs: Recent Labs    03/19/20 1952 03/19/20 2150 03/20/20 0227 03/20/20 0515  HGB 11.9*  --  10.8*  --   HCT 36.0  --  32.4*  --   PLT 209  --  160  --   APTT 30  --   --   --   LABPROT 12.9  --  13.9  --   INR 1.0  --  1.1  --   HEPARINUNFRC  --   --  0.33  --   CREATININE 1.01*  --   --   --   TROPONINIHS 5 29* 152* 110*    Estimated Creatinine Clearance: 52.9 mL/min (A) (by C-G formula based on SCr of 1.01 mg/dL (H)).   Medical History: Past Medical History:  Diagnosis Date   COPD (chronic obstructive pulmonary disease) (HCC)    Hypertension     Medications:  No anticoagulants prior to admission - confirmed with pt by nursing  Assessment: 70 yo F presenting with radiating chest pain. PMH includes CAD s/p stent placement 10 yrs ago. May undergo cardiac cath. Pharmacy consulted to dose heparin for chest pain.   Baseline labs ordered. Hgb 11.9, plts 209.   02/11 0227 HL 0.33, therapeutic  Goal of Therapy:  Heparin level 0.3-0.7 units/ml Monitor platelets by anticoagulation protocol: Yes   Plan:  Continue heparin infusion at 850 units/hr. Recheck HL in 6 hours to confirm then daily. Continue to monitor H&H and plts.  04/11, PharmD, [facility] 03/20/2020 6:31 AM

## 2020-03-20 NOTE — ED Notes (Signed)
MD Mansy notified that patient remains hypotensive despite completion of 250 mL bolus. Awaiting response.

## 2020-03-20 NOTE — Consult Note (Signed)
ANTICOAGULATION CONSULT NOTE   Pharmacy Consult for heparin Indication: chest pain/ACS  Allergies  Allergen Reactions   Penicillins Anaphylaxis    Patient Measurements: Height: 5\' 5"  (165.1 cm) Weight: 71.7 kg (158 lb) IBW/kg (Calculated) : 57 Heparin Dosing Weight: 71.7  Vital Signs: BP: 121/59 (02/11 0730) Pulse Rate: 50 (02/11 0730)  Labs: Recent Labs    03/19/20 1952 03/19/20 2150 03/20/20 0227 03/20/20 0515 03/20/20 0827  HGB 11.9*  --  10.8*  --   --   HCT 36.0  --  32.4*  --   --   PLT 209  --  160  --   --   APTT 30  --   --   --   --   LABPROT 12.9  --  13.9  --   --   INR 1.0  --  1.1  --   --   HEPARINUNFRC  --   --  0.33  --  0.25*  CREATININE 1.01*  --  0.85  --   --   TROPONINIHS 5 29* 152* 110*  --     Estimated Creatinine Clearance: 62.9 mL/min (by C-G formula based on SCr of 0.85 mg/dL).   Medical History: Past Medical History:  Diagnosis Date   COPD (chronic obstructive pulmonary disease) (HCC)    Hypertension     Medications:  No anticoagulants prior to admission - confirmed with pt by nursing  Assessment: 70 yo F presenting with radiating chest pain. PMH includes CAD s/p stent placement 10 yrs ago. May undergo cardiac cath. Pharmacy consulted to dose heparin for chest pain.   Baseline labs ordered. Hgb 11.9, plts 209.   02/11 0227 HL 0.33, therapeutic Continue heparin infusion at 850 units/hr.  Goal of Therapy:  Heparin level 0.3-0.7 units/ml Monitor platelets by anticoagulation protocol: Yes   Plan:  02/11 0827  HL= 0.25 subtherapeutic. Will order 1100 units bolus x 1 and increase drip rate to 1000 units/hr. Recheck HL in 6 hours to confirm.  Patient possibly going to Cath Lab today 2/11. Continue to monitor H&H and plts.  4/11 PharmD Clinical Pharmacist 03/20/2020

## 2020-03-20 NOTE — ED Notes (Signed)
Date and time results received: 03/20/20 0659  Test: Troponin Critical Value: 110  Name of Provider Notified: NP Jon Billings  Orders Received? Or Actions Taken?: No new orders at this time

## 2020-03-21 DIAGNOSIS — I214 Non-ST elevation (NSTEMI) myocardial infarction: Secondary | ICD-10-CM | POA: Diagnosis not present

## 2020-03-21 DIAGNOSIS — J431 Panlobular emphysema: Secondary | ICD-10-CM | POA: Diagnosis not present

## 2020-03-21 DIAGNOSIS — I10 Essential (primary) hypertension: Secondary | ICD-10-CM | POA: Diagnosis not present

## 2020-03-21 LAB — CBC
HCT: 34.7 % — ABNORMAL LOW (ref 36.0–46.0)
Hemoglobin: 11.9 g/dL — ABNORMAL LOW (ref 12.0–15.0)
MCH: 30.2 pg (ref 26.0–34.0)
MCHC: 34.3 g/dL (ref 30.0–36.0)
MCV: 88.1 fL (ref 80.0–100.0)
Platelets: 178 10*3/uL (ref 150–400)
RBC: 3.94 MIL/uL (ref 3.87–5.11)
RDW: 11.9 % (ref 11.5–15.5)
WBC: 8.7 10*3/uL (ref 4.0–10.5)
nRBC: 0 % (ref 0.0–0.2)

## 2020-03-21 LAB — BASIC METABOLIC PANEL
Anion gap: 8 (ref 5–15)
BUN: 21 mg/dL (ref 8–23)
CO2: 27 mmol/L (ref 22–32)
Calcium: 9.9 mg/dL (ref 8.9–10.3)
Chloride: 106 mmol/L (ref 98–111)
Creatinine, Ser: 0.77 mg/dL (ref 0.44–1.00)
GFR, Estimated: >60 mL/min (ref >60)
Glucose, Bld: 99 mg/dL (ref 70–99)
Potassium: 3.7 mmol/L (ref 3.5–5.1)
Sodium: 141 mmol/L (ref 135–145)

## 2020-03-21 LAB — MAGNESIUM: Magnesium: 2 mg/dL (ref 1.7–2.4)

## 2020-03-21 MED ORDER — METOPROLOL TARTRATE 25 MG PO TABS: 12.5000 mg | ORAL_TABLET | Freq: Two times a day (BID) | ORAL | 0 refills | Status: DC

## 2020-03-21 MED ORDER — TICAGRELOR 90 MG PO TABS: 90.0000 mg | ORAL_TABLET | Freq: Two times a day (BID) | ORAL | 0 refills | Status: DC

## 2020-03-21 MED ORDER — ASPIRIN 81 MG PO TBEC: 81.0000 mg | DELAYED_RELEASE_TABLET | Freq: Every day | ORAL | 0 refills | Status: DC

## 2020-03-21 MED ORDER — ATORVASTATIN CALCIUM 80 MG PO TABS: 80.0000 mg | ORAL_TABLET | Freq: Every day | ORAL | 0 refills | Status: DC

## 2020-03-21 NOTE — Plan of Care (Signed)
  Problem: Education: Goal: Knowledge of General Education information will improve Description: Including pain rating scale, medication(s)/side effects and non-pharmacologic comfort measures Outcome: Adequate for Discharge   Problem: Health Behavior/Discharge Planning: Goal: Ability to manage health-related needs will improve Outcome: Adequate for Discharge   Problem: Clinical Measurements: Goal: Will remain free from infection Outcome: Adequate for Discharge   

## 2020-03-21 NOTE — Progress Notes (Signed)
SUBJECTIVE: Patient is feeling much better   Vitals:   03/20/20 2008 03/20/20 2316 03/21/20 0423 03/21/20 0807  BP: 129/62 (!) 118/56 140/75 (!) 156/75  Pulse: 76 82 90 75  Resp: 16 18 16 18   Temp: 98.4 F (36.9 C) 98.5 F (36.9 C) 98.2 F (36.8 C) 99.4 F (37.4 C)  TempSrc:  Oral  Oral  SpO2: 98% 99% 99% 98%  Weight:   76.5 kg   Height:        Intake/Output Summary (Last 24 hours) at 03/21/2020 1131 Last data filed at 03/21/2020 0950 Gross per 24 hour  Intake 1656.92 ml  Output 750 ml  Net 906.92 ml    LABS: Basic Metabolic Panel: Recent Labs    03/19/20 2150 03/20/20 0227 03/20/20 1707 03/21/20 0609  NA  --    < > 138 141  K  --    < > 3.3* 3.7  CL  --    < > 104 106  CO2  --    < > 23 27  GLUCOSE  --    < > 106* 99  BUN  --    < > 21 21  CREATININE  --    < > 0.70 0.77  CALCIUM  --    < > 9.2 9.9  MG 2.1  --   --  2.0   < > = values in this interval not displayed.   Liver Function Tests: Recent Labs    03/19/20 1952  AST 22  ALT 13  ALKPHOS 70  BILITOT 0.5  PROT 7.2  ALBUMIN 4.2   No results for input(s): LIPASE, AMYLASE in the last 72 hours. CBC: Recent Labs    03/19/20 1952 03/20/20 0227 03/20/20 1707 03/21/20 0609  WBC 8.8   < > 9.3 8.7  NEUTROABS 5.2  --   --   --   HGB 11.9*   < > 11.8* 11.9*  HCT 36.0   < > 35.7* 34.7*  MCV 89.6   < > 90.4 88.1  PLT 209   < > 184 178   < > = values in this interval not displayed.   Cardiac Enzymes: No results for input(s): CKTOTAL, CKMB, CKMBINDEX, TROPONINI in the last 72 hours. BNP: Invalid input(s): POCBNP D-Dimer: No results for input(s): DDIMER in the last 72 hours. Hemoglobin A1C: No results for input(s): HGBA1C in the last 72 hours. Fasting Lipid Panel: Recent Labs    03/20/20 0227  CHOL 139  HDL 37*  LDLCALC 80  TRIG 05/18/20  CHOLHDL 3.8   Thyroid Function Tests: Recent Labs    03/20/20 0515  TSH 0.507   Anemia Panel: No results for input(s): VITAMINB12, FOLATE, FERRITIN,  TIBC, IRON, RETICCTPCT in the last 72 hours.   PHYSICAL EXAM General: Well developed, well nourished, in no acute distress HEENT:  Normocephalic and atramatic Neck:  No JVD.  Lungs: Clear bilaterally to auscultation and percussion. Heart: HRRR . Normal S1 and S2 without gallops or murmurs.  Abdomen: Bowel sounds are positive, abdomen soft and non-tender  Msk:  Back normal, normal gait. Normal strength and tone for age. Extremities: No clubbing, cyanosis or edema.   Neuro: Alert and oriented X 3. Psych:  Good affect, responds appropriately  TELEMETRY: Sinus rhythm  ASSESSMENT AND PLAN: Status post PCI of the PLB branch of right coronary artery with stenting.  Patient is asymptomatic and no further chest pain.  Patient can be discharged with follow-up 9:30 AM in my office on  Monday morning.  Active Problems:   Chest pain   NSTEMI (non-ST elevated myocardial infarction) (HCC)   Essential hypertension   COPD (chronic obstructive pulmonary disease) (HCC)    Sabrina Barker A, MD, [facility] 03/21/2020 11:31 AM

## 2020-03-21 NOTE — Discharge Summary (Signed)
Physician Discharge Summary  Patient ID: Sabrina Barker MRN: [id number] DOB/AGE: Sep 12, 1950 70 y.o.  Admit date: 03/19/2020 Discharge date: 03/21/2020  Admission Diagnoses:  Discharge Diagnoses:  Active Problems:   Chest pain   NSTEMI (non-ST elevated myocardial infarction) [facility])   Essential hypertension   COPD (chronic obstructive pulmonary disease) [facility])   Discharged Condition: good  Hospital Course:  Sabrina Barker a 69 y.o.Caucasian femalewith medical history significant forcoronary artery disease, hypertension, COPD as well as ongoing tobacco abuse, who presented to the emergency room with acute onset ofmidsternal chest pain. Peak troponin 152.   Heart cath 2/11 showed 95% stenosis in RPDA, performed PCI and stenting  #1.  Non-STEMI. Status post heart cath and drug-eluting stent.  Patient condition has been stable overnight.  Patient be discharged home after seen by cardiology.  Continue beta-blocker, dual antiplatelet treatment, and high-dose statin.  Schedule follow-up with cardiology.  Also asked social worker to set up a PCP.  #2.  COPD pain stable.  3.  Essential hypertension. Continue home medicines   Consults: cardiology  Significant Diagnostic Studies:  Heart cath:  Mid RCA lesion is 30% stenosed.  RPDA lesion is 95% stenosed.   1. Severe single-vessel disease involving proximal PLV branch, as detailed in Dr. Milta Deiters diagnostic catheterization report. 2. Successful PCI to PLV extending back to distal RCA using Resolute Onyx 2.75 x 12 mm drug-eluting stent kissing balloon inflation in the PLV and PDA.    Treatments: Heart cath, heparin drip  Discharge Exam: Blood pressure (!) 156/75, pulse 75, temperature 99.4 F (37.4 C), temperature source Oral, resp. rate 18, height 5\' 5"  (1.651 m), weight 76.5 kg, SpO2 98 %. General appearance: alert and cooperative Resp: clear to auscultation bilaterally Cardio: regular rate and rhythm, S1, S2 normal, no  murmur, click, rub or gallop GI: soft, non-tender; bowel sounds normal; no masses,  no organomegaly Extremities: extremities normal, atraumatic, no cyanosis or edema  Disposition: Discharge disposition: 01-Home or Self Care       Discharge Instructions    AMB Referral to Cardiac Rehabilitation - Phase II   Complete by: As directed    Diagnosis:  NSTEMI Coronary Stents     After initial evaluation and assessments completed: Virtual Based Care may be provided alone or in conjunction with Phase 2 Cardiac Rehab based on patient barriers.: Yes   Diet - low sodium heart healthy   Complete by: As directed    Increase activity slowly   Complete by: As directed      Allergies as of 03/21/2020      Reactions   Penicillins Anaphylaxis      Medication List    STOP taking these medications   simvastatin 20 MG tablet Commonly known as: ZOCOR     TAKE these medications   aspirin 81 MG EC tablet Take 1 tablet (81 mg total) by mouth daily. Swallow whole. Start taking on: March 22, 2020   atorvastatin 80 MG tablet Commonly known as: LIPITOR Take 1 tablet (80 mg total) by mouth daily. Start taking on: March 22, 2020   metoprolol tartrate 25 MG tablet Commonly known as: LOPRESSOR Take 0.5 tablets (12.5 mg total) by mouth 2 (two) times daily.   phentermine 37.5 MG tablet Commonly known as: ADIPEX-P Take 37.5 mg by mouth every morning.   ticagrelor 90 MG Tabs tablet Commonly known as: BRILINTA Take 1 tablet (90 mg total) by mouth 2 (two) times daily.       Follow-up Information    End,  Cristal Deer, MD Follow up in 1 week(s).   Specialty: Cardiology Contact information: [address] [place] [address] [phone]               Signed: Marrion Coy 03/21/2020, 10:34 AM

## 2020-03-21 NOTE — Plan of Care (Signed)
  Problem: Education: Goal: Knowledge of General Education information will improve Description: Including pain rating scale, medication(s)/side effects and non-pharmacologic comfort measures 03/21/2020 1110 by Jerene Dilling, RN Outcome: Adequate for Discharge 03/21/2020 1110 by Jerene Dilling, RN Outcome: Adequate for Discharge   Problem: Clinical Measurements: Goal: Will remain free from infection 03/21/2020 1110 by Edison Pace D, RN Outcome: Adequate for Discharge 03/21/2020 1110 by Jerene Dilling, RN Outcome: Adequate for Discharge   Problem: Health Behavior/Discharge Planning: Goal: Ability to manage health-related needs will improve 03/21/2020 1110 by Jerene Dilling, RN Outcome: Adequate for Discharge 03/21/2020 1110 by Jerene Dilling, RN Outcome: Adequate for Discharge

## 2020-03-23 ENCOUNTER — Encounter: Payer: Self-pay | Admitting: Pain Medicine

## 2020-03-23 ENCOUNTER — Encounter: Payer: Self-pay | Admitting: Cardiovascular Disease

## 2020-03-26 ENCOUNTER — Ambulatory Visit: Payer: Medicare Other | Admitting: Nurse Practitioner

## 2020-07-08 ENCOUNTER — Other Ambulatory Visit: Payer: Self-pay | Admitting: Physical Medicine & Rehabilitation

## 2020-07-08 DIAGNOSIS — G8929 Other chronic pain: Secondary | ICD-10-CM

## 2020-07-08 DIAGNOSIS — M542 Cervicalgia: Secondary | ICD-10-CM

## 2020-07-22 ENCOUNTER — Ambulatory Visit
Admission: RE | Admit: 2020-07-22 | Discharge: 2020-07-22 | Disposition: A | Discharge status: Home / Self Care | Payer: Medicare Other | Source: Ambulatory Visit | Attending: Physical Medicine & Rehabilitation | Admitting: Physical Medicine & Rehabilitation

## 2020-07-22 ENCOUNTER — Other Ambulatory Visit: Payer: Self-pay

## 2020-07-22 DIAGNOSIS — M5441 Lumbago with sciatica, right side: Secondary | ICD-10-CM | POA: Insufficient documentation

## 2020-07-22 DIAGNOSIS — G8929 Other chronic pain: Secondary | ICD-10-CM

## 2020-07-22 DIAGNOSIS — M542 Cervicalgia: Secondary | ICD-10-CM | POA: Diagnosis not present

## 2020-07-22 DIAGNOSIS — M5442 Lumbago with sciatica, left side: Secondary | ICD-10-CM | POA: Insufficient documentation

## 2020-07-28 ENCOUNTER — Other Ambulatory Visit: Payer: Self-pay | Admitting: Family

## 2020-07-28 DIAGNOSIS — S99912A Unspecified injury of left ankle, initial encounter: Secondary | ICD-10-CM

## 2020-07-28 DIAGNOSIS — S99922A Unspecified injury of left foot, initial encounter: Secondary | ICD-10-CM

## 2020-08-03 ENCOUNTER — Encounter: Payer: Self-pay | Admitting: Student in an Organized Health Care Education/Training Program

## 2020-08-05 ENCOUNTER — Ambulatory Visit: Payer: Medicare Other | Admitting: Pain Medicine

## 2020-08-08 ENCOUNTER — Ambulatory Visit: Payer: Medicare Other

## 2021-12-03 ENCOUNTER — Ambulatory Visit
Admission: RE | Admit: 2021-12-03 | Discharge: 2021-12-03 | Disposition: A | Discharge status: Home / Self Care | Payer: Medicare Other | Attending: Family | Admitting: Family

## 2021-12-03 ENCOUNTER — Ambulatory Visit
Admission: RE | Admit: 2021-12-03 | Discharge: 2021-12-03 | Disposition: A | Discharge status: Home / Self Care | Payer: Medicare Other | Source: Ambulatory Visit | Attending: Family | Admitting: Family

## 2021-12-03 ENCOUNTER — Other Ambulatory Visit: Payer: Self-pay | Admitting: Family

## 2021-12-03 DIAGNOSIS — R071 Chest pain on breathing: Secondary | ICD-10-CM

## 2021-12-03 DIAGNOSIS — R0600 Dyspnea, unspecified: Secondary | ICD-10-CM | POA: Insufficient documentation

## 2022-01-03 ENCOUNTER — Ambulatory Visit: Payer: Medicare Other | Admitting: Family Medicine

## 2022-01-26 ENCOUNTER — Encounter: Payer: Self-pay | Admitting: Emergency Medicine

## 2022-01-26 ENCOUNTER — Emergency Department: Payer: Medicare Other

## 2022-01-26 ENCOUNTER — Inpatient Hospital Stay
Admission: EM | Admit: 2022-01-26 | Discharge: 2022-01-30 | DRG: 641 | Disposition: A | Discharge status: Home / Self Care | Payer: Medicare Other | Source: Ambulatory Visit | Attending: General Practice | Admitting: General Practice

## 2022-01-26 ENCOUNTER — Other Ambulatory Visit: Payer: Self-pay

## 2022-01-26 DIAGNOSIS — E639 Nutritional deficiency, unspecified: Secondary | ICD-10-CM | POA: Diagnosis present

## 2022-01-26 DIAGNOSIS — R7401 Elevation of levels of liver transaminase levels: Secondary | ICD-10-CM | POA: Diagnosis present

## 2022-01-26 DIAGNOSIS — A0811 Acute gastroenteropathy due to Norwalk agent: Secondary | ICD-10-CM

## 2022-01-26 DIAGNOSIS — K529 Noninfective gastroenteritis and colitis, unspecified: Secondary | ICD-10-CM | POA: Diagnosis present

## 2022-01-26 DIAGNOSIS — I959 Hypotension, unspecified: Secondary | ICD-10-CM | POA: Diagnosis present

## 2022-01-26 DIAGNOSIS — Z1152 Encounter for screening for COVID-19: Secondary | ICD-10-CM

## 2022-01-26 DIAGNOSIS — F32A Depression, unspecified: Secondary | ICD-10-CM | POA: Diagnosis present

## 2022-01-26 DIAGNOSIS — Z880 Allergy status to penicillin: Secondary | ICD-10-CM

## 2022-01-26 DIAGNOSIS — I252 Old myocardial infarction: Secondary | ICD-10-CM

## 2022-01-26 DIAGNOSIS — F17210 Nicotine dependence, cigarettes, uncomplicated: Secondary | ICD-10-CM | POA: Diagnosis present

## 2022-01-26 DIAGNOSIS — Z90710 Acquired absence of both cervix and uterus: Secondary | ICD-10-CM

## 2022-01-26 DIAGNOSIS — N179 Acute kidney failure, unspecified: Secondary | ICD-10-CM | POA: Diagnosis not present

## 2022-01-26 DIAGNOSIS — Z823 Family history of stroke: Secondary | ICD-10-CM

## 2022-01-26 DIAGNOSIS — E785 Hyperlipidemia, unspecified: Secondary | ICD-10-CM | POA: Diagnosis present

## 2022-01-26 DIAGNOSIS — Z8249 Family history of ischemic heart disease and other diseases of the circulatory system: Secondary | ICD-10-CM

## 2022-01-26 DIAGNOSIS — E876 Hypokalemia: Secondary | ICD-10-CM | POA: Diagnosis not present

## 2022-01-26 DIAGNOSIS — R197 Diarrhea, unspecified: Secondary | ICD-10-CM

## 2022-01-26 DIAGNOSIS — T50916A Underdosing of multiple unspecified drugs, medicaments and biological substances, initial encounter: Secondary | ICD-10-CM | POA: Diagnosis present

## 2022-01-26 DIAGNOSIS — M5136 Other intervertebral disc degeneration, lumbar region: Secondary | ICD-10-CM | POA: Diagnosis present

## 2022-01-26 DIAGNOSIS — Z91041 Radiographic dye allergy status: Secondary | ICD-10-CM

## 2022-01-26 DIAGNOSIS — Z955 Presence of coronary angioplasty implant and graft: Secondary | ICD-10-CM

## 2022-01-26 DIAGNOSIS — E860 Dehydration: Secondary | ICD-10-CM

## 2022-01-26 DIAGNOSIS — I10 Essential (primary) hypertension: Secondary | ICD-10-CM | POA: Diagnosis present

## 2022-01-26 DIAGNOSIS — F411 Generalized anxiety disorder: Secondary | ICD-10-CM | POA: Diagnosis present

## 2022-01-26 DIAGNOSIS — G894 Chronic pain syndrome: Secondary | ICD-10-CM | POA: Diagnosis present

## 2022-01-26 DIAGNOSIS — Z720 Tobacco use: Secondary | ICD-10-CM | POA: Insufficient documentation

## 2022-01-26 DIAGNOSIS — J4489 Other specified chronic obstructive pulmonary disease: Secondary | ICD-10-CM | POA: Diagnosis present

## 2022-01-26 DIAGNOSIS — Z79899 Other long term (current) drug therapy: Secondary | ICD-10-CM

## 2022-01-26 DIAGNOSIS — J302 Other seasonal allergic rhinitis: Secondary | ICD-10-CM | POA: Diagnosis present

## 2022-01-26 DIAGNOSIS — M1990 Unspecified osteoarthritis, unspecified site: Secondary | ICD-10-CM | POA: Diagnosis present

## 2022-01-26 DIAGNOSIS — Z91138 Patient's unintentional underdosing of medication regimen for other reason: Secondary | ICD-10-CM

## 2022-01-26 DIAGNOSIS — Z7982 Long term (current) use of aspirin: Secondary | ICD-10-CM

## 2022-01-26 DIAGNOSIS — K219 Gastro-esophageal reflux disease without esophagitis: Secondary | ICD-10-CM | POA: Diagnosis present

## 2022-01-26 DIAGNOSIS — J449 Chronic obstructive pulmonary disease, unspecified: Secondary | ICD-10-CM | POA: Diagnosis present

## 2022-01-26 DIAGNOSIS — I2510 Atherosclerotic heart disease of native coronary artery without angina pectoris: Secondary | ICD-10-CM | POA: Diagnosis present

## 2022-01-26 HISTORY — DX: Noninfective gastroenteritis and colitis, unspecified: K52.9

## 2022-01-26 LAB — CBC
HCT: 45.2 % (ref 36.0–46.0)
Hemoglobin: 16 g/dL — ABNORMAL HIGH (ref 12.0–15.0)
MCH: 29.7 pg (ref 26.0–34.0)
MCHC: 35.4 g/dL (ref 30.0–36.0)
MCV: 84 fL (ref 80.0–100.0)
Platelets: 302 10*3/uL (ref 150–400)
RBC: 5.38 MIL/uL — ABNORMAL HIGH (ref 3.87–5.11)
RDW: 12.5 % (ref 11.5–15.5)
WBC: 6.8 10*3/uL (ref 4.0–10.5)
nRBC: 0 % (ref 0.0–0.2)

## 2022-01-26 LAB — URINALYSIS, ROUTINE W REFLEX MICROSCOPIC
Bacteria, UA: NONE SEEN
Bilirubin Urine: NEGATIVE
Glucose, UA: NEGATIVE mg/dL
Ketones, ur: NEGATIVE mg/dL
Nitrite: NEGATIVE
Protein, ur: NEGATIVE mg/dL
Specific Gravity, Urine: 1.009 (ref 1.005–1.030)
pH: 5 (ref 5.0–8.0)

## 2022-01-26 LAB — RESP PANEL BY RT-PCR (RSV, FLU A&B, COVID)  RVPGX2
Influenza A by PCR: NEGATIVE
Influenza B by PCR: NEGATIVE
Resp Syncytial Virus by PCR: NEGATIVE
SARS Coronavirus 2 by RT PCR: NEGATIVE

## 2022-01-26 LAB — COMPREHENSIVE METABOLIC PANEL
ALT: 45 U/L — ABNORMAL HIGH (ref 0–44)
AST: 43 U/L — ABNORMAL HIGH (ref 15–41)
Albumin: 4.1 g/dL (ref 3.5–5.0)
Alkaline Phosphatase: 52 U/L (ref 38–126)
Anion gap: 16 — ABNORMAL HIGH (ref 5–15)
BUN: 37 mg/dL — ABNORMAL HIGH (ref 8–23)
CO2: 21 mmol/L — ABNORMAL LOW (ref 22–32)
Calcium: 8.5 mg/dL — ABNORMAL LOW (ref 8.9–10.3)
Chloride: 98 mmol/L (ref 98–111)
Creatinine, Ser: 1.77 mg/dL — ABNORMAL HIGH (ref 0.44–1.00)
GFR, Estimated: 31 mL/min — ABNORMAL LOW (ref >60)
Glucose, Bld: 139 mg/dL — ABNORMAL HIGH (ref 70–99)
Potassium: 2.3 mmol/L — CL (ref 3.5–5.1)
Sodium: 135 mmol/L (ref 135–145)
Total Bilirubin: 1.2 mg/dL (ref 0.3–1.2)
Total Protein: 7.4 g/dL (ref 6.5–8.1)

## 2022-01-26 LAB — LIPASE, BLOOD: Lipase: 27 U/L (ref 11–51)

## 2022-01-26 LAB — LACTIC ACID, PLASMA
Lactic Acid, Venous: 2.2 mmol/L (ref 0.5–1.9)
Lactic Acid, Venous: 1.2 mmol/L (ref 0.5–1.9)

## 2022-01-26 LAB — MAGNESIUM: Magnesium: 0.9 mg/dL — CL (ref 1.7–2.4)

## 2022-01-26 MED ORDER — POTASSIUM CHLORIDE IN NACL 40-0.9 MEQ/L-% IV SOLN
INTRAVENOUS | Status: DC
  Filled 2022-01-26 (×2): qty 1000

## 2022-01-26 MED ORDER — ONDANSETRON HCL 4 MG PO TABS: 4.0000 mg | ORAL_TABLET | Freq: Four times a day (QID) | ORAL | Status: DC | PRN

## 2022-01-26 MED ORDER — MORPHINE SULFATE (PF) 4 MG/ML IV SOLN
4.0000 mg | INTRAVENOUS | Status: DC | PRN
  Administered 2022-01-26 – 2022-01-27 (×5): 4 mg via INTRAVENOUS
  Filled 2022-01-26 (×6): qty 1

## 2022-01-26 MED ORDER — ACETAMINOPHEN 650 MG RE SUPP: 650.0000 mg | Freq: Four times a day (QID) | RECTAL | Status: DC | PRN

## 2022-01-26 MED ORDER — POTASSIUM CHLORIDE 10 MEQ/100ML IV SOLN
10.0000 meq | INTRAVENOUS | Status: AC
  Administered 2022-01-26 (×4): 10 meq via INTRAVENOUS
  Filled 2022-01-26 (×4): qty 100

## 2022-01-26 MED ORDER — NICOTINE 14 MG/24HR TD PT24: 14.0000 mg | MEDICATED_PATCH | Freq: Every day | TRANSDERMAL | Status: DC | PRN

## 2022-01-26 MED ORDER — PANTOPRAZOLE SODIUM 40 MG PO TBEC
40.0000 mg | DELAYED_RELEASE_TABLET | Freq: Every day | ORAL | Status: DC
  Administered 2022-01-27 – 2022-01-30 (×5): 40 mg via ORAL
  Filled 2022-01-26 (×5): qty 1

## 2022-01-26 MED ORDER — MAGNESIUM SULFATE 2 GM/50ML IV SOLN
2.0000 g | Freq: Once | INTRAVENOUS | Status: AC
  Administered 2022-01-26: 2 g via INTRAVENOUS
  Filled 2022-01-26: qty 50

## 2022-01-26 MED ORDER — LACTATED RINGERS IV BOLUS
1000.0000 mL | Freq: Once | INTRAVENOUS | Status: AC
  Administered 2022-01-26: 1000 mL via INTRAVENOUS

## 2022-01-26 MED ORDER — IOHEXOL 300 MG/ML  SOLN: 80.0000 mL | Freq: Once | INTRAMUSCULAR | Status: DC | PRN

## 2022-01-26 MED ORDER — ONDANSETRON HCL 4 MG/2ML IJ SOLN
4.0000 mg | Freq: Once | INTRAMUSCULAR | Status: AC
  Administered 2022-01-26: 4 mg via INTRAVENOUS
  Filled 2022-01-26: qty 2

## 2022-01-26 MED ORDER — ACETAMINOPHEN 325 MG PO TABS: 650.0000 mg | ORAL_TABLET | Freq: Four times a day (QID) | ORAL | Status: DC | PRN

## 2022-01-26 MED ORDER — ONDANSETRON HCL 4 MG/2ML IJ SOLN
4.0000 mg | Freq: Four times a day (QID) | INTRAMUSCULAR | Status: DC | PRN
  Administered 2022-01-26 – 2022-01-27 (×2): 4 mg via INTRAVENOUS
  Filled 2022-01-26 (×2): qty 2

## 2022-01-26 NOTE — ED Provider Notes (Signed)
[facility] Provider Note    Event Date/Time   First MD Initiated Contact with Patient 01/26/22 1709     (approximate)   History   Emesis and Diarrhea   HPI  Sabrina Barker is a 71 y.o. female who presents to the ER for evaluation of several weeks of nausea vomiting diarrhea.  States nonbloody nonmelanotic.  [place] not been able to keep anything down.  Will have occasional crampy abdominal pain.  No recent antibiotic use.  No cough or congestion.  No new medications.     Physical Exam   Triage Vital Signs: ED Triage Vitals  Enc Vitals Group     BP 01/26/22 1526 (!) 132/95     Pulse Rate 01/26/22 1526 99     Resp 01/26/22 1526 20     Temp 01/26/22 1526 97.9 F (36.6 C)     Temp Source 01/26/22 1526 Oral     SpO2 01/26/22 1526 99 %     Weight 01/26/22 1522 170 lb (77.1 kg)     Height 01/26/22 1522 5\' 5"  (1.651 m)     Head Circumference --      Peak Flow --      Pain Score 01/26/22 1522 10     Pain Loc --      Pain Edu? --      Excl. in GC? --     Most recent vital signs: Vitals:   01/26/22 1730 01/26/22 1815  BP: 134/79   Pulse: (!) 107 (!) 101  Resp: (!) 29 (!) 22  Temp:    SpO2: 100% 100%     Constitutional: Alert  Eyes: Conjunctivae are normal.  Head: Atraumatic. Nose: No congestion/rhinnorhea. Mouth/Throat: Mucous membranes are moist.   Neck: Painless ROM.  Cardiovascular:   Good peripheral circulation. Respiratory: Normal respiratory effort.  No retractions.  Gastrointestinal: Soft with mild generalized tenderness palpation no guarding or rebound. Musculoskeletal:  no deformity Neurologic:  MAE spontaneously. No gross focal neurologic deficits are appreciated.  Skin:  Skin is warm, dry and intact. No rash noted. Psychiatric: Mood and affect are normal. Speech and behavior are normal.    ED Results / Procedures / Treatments   Labs (all labs ordered are listed, but only abnormal results are displayed) Labs Reviewed   CBC - Abnormal; Notable for the following components:      Result Value   RBC 5.38 (*)    Hemoglobin 16.0 (*)    All other components within normal limits  COMPREHENSIVE METABOLIC PANEL - Abnormal; Notable for the following components:   Potassium 2.3 (*)    CO2 21 (*)    Glucose, Bld 139 (*)    BUN 37 (*)    Creatinine, Ser 1.77 (*)    Calcium 8.5 (*)    AST 43 (*)    ALT 45 (*)    GFR, Estimated 31 (*)    Anion gap 16 (*)    All other components within normal limits  LACTIC ACID, PLASMA - Abnormal; Notable for the following components:   Lactic Acid, Venous 2.2 (*)    All other components within normal limits  MAGNESIUM - Abnormal; Notable for the following components:   Magnesium 0.9 (*)    All other components within normal limits  RESP PANEL BY RT-PCR (RSV, FLU A&B, COVID)  RVPGX2  GASTROINTESTINAL PANEL BY PCR, STOOL (REPLACES STOOL CULTURE)  C DIFFICILE QUICK SCREEN W PCR REFLEX    LIPASE, BLOOD  URINALYSIS, ROUTINE W  REFLEX MICROSCOPIC  LACTIC ACID, PLASMA  HIV ANTIBODY (ROUTINE TESTING W REFLEX)  CBC  COMPREHENSIVE METABOLIC PANEL  MAGNESIUM  PHOSPHORUS  CBG MONITORING, ED     EKG  ED ECG REPORT I, Willy Eddy, the attending physician, personally viewed and interpreted this ECG.   Date: 01/26/2022  EKG Time: 15:26  Rate: 100  Rhythm: sinus  Axis: left  Intervals:normal  ST&T Change: no stemi, no depressions    RADIOLOGY Please see ED Course for my review and interpretation.  I personally reviewed all radiographic images ordered to evaluate for the above acute complaints and reviewed radiology reports and findings.  These findings were personally discussed with the patient.  Please see medical record for radiology report.    PROCEDURES:  Critical Care performed: No  Procedures   MEDICATIONS ORDERED IN ED: Medications  potassium chloride 10 mEq in 100 mL IVPB (10 mEq Intravenous New Bag/Given 01/26/22 1850)  morphine (PF) 4 MG/ML  injection 4 mg (4 mg Intravenous Given 01/26/22 1738)  magnesium sulfate IVPB 2 g 50 mL (2 g Intravenous New Bag/Given 01/26/22 1852)  0.9 % NaCl with KCl 40 mEq / L  infusion (0 mL/hr Intravenous Hold 01/26/22 1932)  acetaminophen (TYLENOL) tablet 650 mg (has no administration in time range)    Or  acetaminophen (TYLENOL) suppository 650 mg (has no administration in time range)  ondansetron (ZOFRAN) tablet 4 mg (has no administration in time range)    Or  ondansetron (ZOFRAN) injection 4 mg (has no administration in time range)  lactated ringers bolus 1,000 mL (1,000 mLs Intravenous New Bag/Given 01/26/22 1747)  ondansetron (ZOFRAN) injection 4 mg (4 mg Intravenous Given 01/26/22 1738)     IMPRESSION / MDM / ASSESSMENT AND PLAN / ED COURSE  I reviewed the triage vital signs and the nursing notes.                              Differential diagnosis includes, but is not limited to, dehydration, electrolyte abnormality, sepsis, colitis, IBD, diverticulitis  Patient presenting to the ER for evaluation of symptoms as described above.  Based on symptoms, risk factors and considered above differential, this presenting complaint could reflect a potentially life-threatening illness therefore the patient will be placed on continuous pulse oximetry and telemetry for monitoring.  Laboratory evaluation will be sent to evaluate for the above complaints.  CT imaging ordered for the blood differential on my review and interpretation does not show any evidence of obstruction or perforation.  CT imaging per radiology without acute findings.  Patient however does have evidence of critically low acute hypokalemia as well as hypomagnesemia.  Mild AKI as well.  Given presentation patient receiving IV fluid boluses as well as IV potassium and IV magnesium repletion.  Will require hospitalization.  I consulted hospitalist for admission.    FINAL CLINICAL IMPRESSION(S) / ED DIAGNOSES   Final diagnoses:   Dehydration  Diarrhea, unspecified type  Hypokalemia  Hypomagnesemia     Rx / DC Orders   ED Discharge Orders     None        Note:  This document was prepared using Dragon voice recognition software and may include unintentional dictation errors.    Willy Eddy, MD 01/26/22 [phone]

## 2022-01-26 NOTE — ED Notes (Signed)
Pt up to restroom to urinate.  

## 2022-01-26 NOTE — ED Triage Notes (Signed)
First Nurse Note;  Pt via EMS from doctors' office. Pt c/o NVD for the past 3 weeks. SBP 70s initally. EMS gave bolus and 4mg  of Zofran, last BP was 118/80. Pt is A&Ox4 and NAD.

## 2022-01-26 NOTE — ED Triage Notes (Signed)
Pt via EMS from PCP office. Pt states she has been having NVD for the past 3 weeks and unable to keep anything down. States she has had some generalized body aches but denies fever. Pt is A&Ox4 and NAD  EMS gave and 4mg  of Zofran.

## 2022-01-26 NOTE — H&P (Signed)
History and Physical    Patient: Sabrina Barker [id number] DOB: 10/10/1950 DOA: 01/26/2022 DOS: the patient was seen and examined on 01/26/2022 PCP: Mechele Claude, FNP  Patient coming from: Home  Chief Complaint:  Chief Complaint  Patient presents with   Emesis   Diarrhea   HPI: Sabrina Barker is a 71 y.o. female with medical history significant of seasonal allergies, anxiety, depression, asthma, COPD, GERD, heart murmur, hyperlipidemia, hypertension, osteoarthritis, CAD, history of NSTEMI, lumbar DDD, chronic pain syndrome who is coming to the emergency department from her doctor's office due to nausea, vomiting and diarrhea for the past 3 weeks.  EMS said that her SBP was in the 70s when they arrived.  They gave her 1000 mL normal saline bolus and 4 mg of Zofran.  When the patient arrived to the emergency department his blood pressure was 118/80 mmHg. she denied any sick contacts or travel history.  Not sure if this was related to fluid intake.  She has had occasional postural dizziness. She has been holding her diuretic and blood pressure medication to avoid dehydration.  No constipation, melena or hematochezia. No fever, chills or night sweats. No sore throat, rhinorrhea, dyspnea, wheezing or hemoptysis. No chest pain, palpitations, diaphoresis, PND, orthopnea or pitting edema of the lower extremities. No flank pain, dysuria, frequency or hematuria.  No polyuria, polydipsia, polyphagia or blurred vision.  ED course: Initial vital signs were temperature 97.9 F, pulse 99, respiration 20, BP 132/95 mmHg O2 sat 99% on room air. She received LR 1000 mL bolus ondansetron 4 mg IVP x 1 and KCl 10 mEq IVPB x 4.  Lab work: Her CBC showed a white count 6.8, hemoglobin 16.0 g/dL platelets 302.  Lactic acid was 2.2 mmol/L.  Lipase was normal.  CMP showed sodium 135, potassium 2.3, chloride 98 and CO2 21 mmol/L with an anion gap of 16.  Glucose 139, BUN 37, creatinine 1.77 (her previous creatinine  level from 2022 was normal, calcium 8.5 and magnesium 0.9 mg/dL.  Her LFTs showed mild transaminitis with an AST of 43 and ALT 45 units/L.  The rest of the LFTs were normal.  Imaging: CT abdomen/pelvis without contrast show aortic atherosclerosis but no acute intra-abdominal or intrapelvic abnormality.   Review of Systems: As mentioned in the history of present illness. All other systems reviewed and are negative.  Past Medical History:  Diagnosis Date   Allergy    Anxiety    Asthma    COPD (chronic obstructive pulmonary disease) ([place])    DDD (degenerative disc disease), thoracic 11/17/2016   Depression    GERD (gastroesophageal reflux disease)    Heart murmur    Hyperlipidemia    Hypertension    Past Surgical History:  Procedure Laterality Date   ABDOMINAL HYSTERECTOMY     APPENDECTOMY     CORONARY STENT INTERVENTION N/A 03/20/2020   Procedure: CORONARY STENT INTERVENTION;  Surgeon: Nelva Bush, MD;  Location: [facility];  Service: Cardiovascular;  Laterality: N/A;   FACIAL COSMETIC SURGERY  2016   LAPAROSCOPY ABDOMEN DIAGNOSTIC  2016   removal of adhesions   LEFT HEART CATH AND CORONARY ANGIOGRAPHY N/A 03/20/2020   Procedure: LEFT HEART CATH AND CORONARY ANGIOGRAPHY with Intervention;  Surgeon: Dionisio Cesar Rogerson, MD;  Location: [facility];  Service: Cardiovascular;  Laterality: N/A;   rhinoplasty  2016   tummy tuck  2016   VASCULAR SURGERY     Social History:  reports that she has been smoking cigarettes.  She has been smoking an average of .15 packs per day. She has never used smokeless tobacco. She reports that she does not drink alcohol and does not use drugs.  Allergies  Allergen Reactions   Iodinated Contrast Media Anaphylaxis    Swelling and can't breath    Penicillins Shortness Of Breath and Swelling    Patient states "I pass out"  EMS is normally called.    Penicillins Anaphylaxis   Shellfish Allergy Anaphylaxis    Iodine fine topically.  Can  not use ivp dye    Family History  Problem Relation Age of Onset   Heart disease Mother    Stroke Mother    Cancer Mother    Heart disease Father    Heart disease Brother    Heart disease Brother     Prior to Admission medications   Medication Sig Start Date End Date Taking? Authorizing Provider  aspirin EC 81 MG EC tablet Take 1 tablet (81 mg total) by mouth daily. Swallow whole. 03/22/20   Marrion Coy, MD  aspirin EC 81 MG tablet Take 81 mg by mouth daily.    [provider]  atorvastatin (LIPITOR) 80 MG tablet Take 1 tablet (80 mg total) by mouth daily. 03/22/20   Marrion Coy, MD  cimetidine (TAGAMET) 300 MG tablet Take 300 mg by mouth 2 (two) times daily.    [provider]  diphenhydrAMINE (BENADRYL) 25 mg capsule Take 25 mg by mouth 2 (two) times daily.    [provider]  gabapentin (NEURONTIN) 400 MG capsule Take 400 mg by mouth at bedtime.    [provider]  ibuprofen (ADVIL,MOTRIN) 200 MG tablet Take 600 mg by mouth 2 (two) times daily.    [provider]  losartan (COZAAR) 25 MG tablet Take 25 mg by mouth daily. 09/08/16   [provider]  metoprolol tartrate (LOPRESSOR) 25 MG tablet Take 25 mg by mouth 2 (two) times daily.    [provider]  metoprolol tartrate (LOPRESSOR) 25 MG tablet Take 0.5 tablets (12.5 mg total) by mouth 2 (two) times daily. 03/21/20   Marrion Coy, MD  phentermine (ADIPEX-P) 37.5 MG tablet Take 37.5 mg by mouth every morning. 02/19/20   [provider]  rosuvastatin (CRESTOR) 5 MG tablet Take 5 mg by mouth daily. 09/08/16   [provider]  sertraline (ZOLOFT) 100 MG tablet Take 100 mg by mouth 2 (two) times daily.    [provider]  ticagrelor (BRILINTA) 90 MG TABS tablet Take 1 tablet (90 mg total) by mouth 2 (two) times daily. 03/21/20   Marrion Coy, MD  traMADol (ULTRAM) 50 MG tablet Take 1 tablet (50 mg total) by mouth every 6 (six) hours as needed for severe  pain. 11/07/16 12/07/16  Delano Metz, MD  traZODone (DESYREL) 100 MG tablet Take 100 mg by mouth at bedtime.    [provider]  VENTOLIN HFA 108 (90 Base) MCG/ACT inhaler Take 2 puffs by mouth as needed. 11/09/16   [provider]    Physical Exam: Vitals:   01/26/22 1522 01/26/22 1526 01/26/22 1730 01/26/22 1815  BP:  (!) 132/95 134/79   Pulse:  99 (!) 107 (!) 101  Resp:  20 (!) 29 (!) 22  Temp:  97.9 F (36.6 C)    TempSrc:  Oral    SpO2:  99% 100% 100%  Weight: 77.1 kg     Height: 5\' 5"  (1.651 m)      Physical Exam Vitals  and nursing note reviewed.  Constitutional:      General: She is awake. She is not in acute distress.    Appearance: She is overweight. She is ill-appearing.  HENT:     Head: Normocephalic.     Nose: No rhinorrhea.     Mouth/Throat:     Mouth: Mucous membranes are dry.  Eyes:     General: No scleral icterus.    Pupils: Pupils are equal, round, and reactive to light.  Neck:     Vascular: No JVD.  Cardiovascular:     Rate and Rhythm: Normal rate and regular rhythm.     Heart sounds: S1 normal and S2 normal.  Pulmonary:     Effort: Pulmonary effort is normal.     Breath sounds: Normal breath sounds. No wheezing, rhonchi or rales.  Abdominal:     General: Bowel sounds are normal. There is no distension.     Palpations: Abdomen is soft.     Tenderness: There is abdominal tenderness in the epigastric area. There is no guarding or rebound.  Musculoskeletal:     Cervical back: Neck supple.     Right lower leg: No edema.     Left lower leg: No edema.  Skin:    General: Skin is warm and dry.  Neurological:     General: No focal deficit present.     Mental Status: She is alert and oriented to person, place, and time.  Psychiatric:        Mood and Affect: Mood normal.        Behavior: Behavior normal. Behavior is cooperative.    Data Reviewed:  There are no new results to review at this time.  Assessment and  Plan: Principal Problem:   AKI (acute kidney injury) ([place]) Observation/telemetry. Continue IV fluids. Hold ARB/ACE. Hold diuretic. Avoid hypotension. Avoid nephrotoxins. Monitor intake and output. Monitor renal function electrolytes.  Active Problems:   Acute gastroenteritis  C.  Diff is still pending. GI pathogen is still pending. Continue IV fluids. Continue symptom management.    Chronic pain syndrome Continue analgesics as needed.    Tobacco use  Tobacco cessation advised. Nicotine replacement therapy ordered as needed.    Generalized anxiety disorder Has not been taking medications due to GI upset. If able to tolerate, resume bupropion, buspirone and trazodone in AM.    Essential hypertension Hold diuretics and ARB. Resume metoprolol in AM.    COPD (chronic obstructive pulmonary disease) (HCC) Bronchodilators as needed. Supplemental oxygen as needed.    Advance Care Planning:   Code Status: Full Code   Consults:   Family Communication:   Severity of Illness: The appropriate patient status for this patient is OBSERVATION. Observation status is judged to be reasonable and necessary in order to provide the required intensity of service to ensure the patient's safety. The patient's presenting symptoms, physical exam findings, and initial radiographic and laboratory data in the context of their medical condition is felt to place them at decreased risk for further clinical deterioration. Furthermore, it is anticipated that the patient will be medically stable for discharge from the hospital within 2 midnights of admission.   Author: Reubin Milan, MD 01/26/2022 6:54 PM  For on call review [email or web address].   This document was prepared using Dragon voice recognition software and may contain some unintended transcription errors.

## 2022-01-27 DIAGNOSIS — N179 Acute kidney failure, unspecified: Secondary | ICD-10-CM | POA: Diagnosis not present

## 2022-01-27 LAB — MAGNESIUM: Magnesium: 1.4 mg/dL — ABNORMAL LOW (ref 1.7–2.4)

## 2022-01-27 LAB — CBC
HCT: 36.5 % (ref 36.0–46.0)
Hemoglobin: 12.8 g/dL (ref 12.0–15.0)
MCH: 29.9 pg (ref 26.0–34.0)
MCHC: 35.1 g/dL (ref 30.0–36.0)
MCV: 85.3 fL (ref 80.0–100.0)
Platelets: 232 10*3/uL (ref 150–400)
RBC: 4.28 MIL/uL (ref 3.87–5.11)
RDW: 12.8 % (ref 11.5–15.5)
WBC: 6.7 10*3/uL (ref 4.0–10.5)
nRBC: 0 % (ref 0.0–0.2)

## 2022-01-27 LAB — COMPREHENSIVE METABOLIC PANEL
ALT: 108 U/L — ABNORMAL HIGH (ref 0–44)
AST: 115 U/L — ABNORMAL HIGH (ref 15–41)
Albumin: 3.2 g/dL — ABNORMAL LOW (ref 3.5–5.0)
Alkaline Phosphatase: 119 U/L (ref 38–126)
Anion gap: 9 (ref 5–15)
BUN: 24 mg/dL — ABNORMAL HIGH (ref 8–23)
CO2: 26 mmol/L (ref 22–32)
Calcium: 7.9 mg/dL — ABNORMAL LOW (ref 8.9–10.3)
Chloride: 106 mmol/L (ref 98–111)
Creatinine, Ser: 1.14 mg/dL — ABNORMAL HIGH (ref 0.44–1.00)
GFR, Estimated: 52 mL/min — ABNORMAL LOW (ref >60)
Glucose, Bld: 106 mg/dL — ABNORMAL HIGH (ref 70–99)
Potassium: 3.2 mmol/L — ABNORMAL LOW (ref 3.5–5.1)
Sodium: 141 mmol/L (ref 135–145)
Total Bilirubin: 0.7 mg/dL (ref 0.3–1.2)
Total Protein: 6.1 g/dL — ABNORMAL LOW (ref 6.5–8.1)

## 2022-01-27 LAB — HIV ANTIBODY (ROUTINE TESTING W REFLEX): HIV Screen 4th Generation wRfx: NONREACTIVE

## 2022-01-27 LAB — PHOSPHORUS
Phosphorus: 1.4 mg/dL — ABNORMAL LOW (ref 2.5–4.6)
Phosphorus: 2.1 mg/dL — ABNORMAL LOW (ref 2.5–4.6)

## 2022-01-27 MED ORDER — ENOXAPARIN SODIUM 40 MG/0.4ML IJ SOSY
40.0000 mg | PREFILLED_SYRINGE | INTRAMUSCULAR | Status: DC
  Administered 2022-01-27 – 2022-01-29 (×3): 40 mg via SUBCUTANEOUS
  Filled 2022-01-27 (×3): qty 0.4

## 2022-01-27 MED ORDER — POTASSIUM PHOSPHATES 15 MMOLE/5ML IV SOLN
30.0000 mmol | Freq: Once | INTRAVENOUS | Status: AC
  Administered 2022-01-27: 30 mmol via INTRAVENOUS
  Filled 2022-01-27: qty 10

## 2022-01-27 MED ORDER — MAGNESIUM SULFATE 2 GM/50ML IV SOLN
2.0000 g | Freq: Once | INTRAVENOUS | Status: AC
  Administered 2022-01-27: 2 g via INTRAVENOUS
  Filled 2022-01-27: qty 50

## 2022-01-27 MED ORDER — TICAGRELOR 90 MG PO TABS
90.0000 mg | ORAL_TABLET | Freq: Two times a day (BID) | ORAL | Status: DC
  Administered 2022-01-27 – 2022-01-30 (×7): 90 mg via ORAL
  Filled 2022-01-27 (×7): qty 1

## 2022-01-27 MED ORDER — TRAMADOL HCL 50 MG PO TABS
50.0000 mg | ORAL_TABLET | Freq: Four times a day (QID) | ORAL | Status: DC | PRN
  Filled 2022-01-27: qty 1

## 2022-01-27 MED ORDER — METOPROLOL SUCCINATE ER 25 MG PO TB24
25.0000 mg | ORAL_TABLET | Freq: Every day | ORAL | Status: DC
  Administered 2022-01-27 – 2022-01-28 (×2): 25 mg via ORAL
  Filled 2022-01-27 (×2): qty 1

## 2022-01-27 MED ORDER — HYDROXYZINE HCL 50 MG PO TABS
25.0000 mg | ORAL_TABLET | Freq: Four times a day (QID) | ORAL | Status: DC | PRN
  Administered 2022-01-27: 25 mg via ORAL
  Filled 2022-01-27 (×2): qty 1

## 2022-01-27 MED ORDER — HYDROXYZINE HCL 50 MG PO TABS: 25.0000 mg | ORAL_TABLET | Freq: Three times a day (TID) | ORAL | Status: DC | PRN

## 2022-01-27 MED ORDER — ALBUTEROL SULFATE (2.5 MG/3ML) 0.083% IN NEBU
2.5000 mg | INHALATION_SOLUTION | RESPIRATORY_TRACT | Status: DC | PRN
  Administered 2022-01-27: 2.5 mg via RESPIRATORY_TRACT
  Filled 2022-01-27: qty 3

## 2022-01-27 MED ORDER — PROCHLORPERAZINE MALEATE 5 MG PO TABS
5.0000 mg | ORAL_TABLET | Freq: Four times a day (QID) | ORAL | Status: DC | PRN
  Administered 2022-01-27: 5 mg via ORAL
  Filled 2022-01-27 (×3): qty 1

## 2022-01-27 MED ORDER — HYDROCODONE-ACETAMINOPHEN 5-325 MG PO TABS
1.0000 | ORAL_TABLET | Freq: Two times a day (BID) | ORAL | Status: DC | PRN
  Administered 2022-01-27 – 2022-01-29 (×5): 1 via ORAL
  Filled 2022-01-27 (×5): qty 1

## 2022-01-27 MED ORDER — GABAPENTIN 300 MG PO CAPS
400.0000 mg | ORAL_CAPSULE | Freq: Every day | ORAL | Status: DC
  Administered 2022-01-27 – 2022-01-29 (×3): 400 mg via ORAL
  Filled 2022-01-27 (×3): qty 1

## 2022-01-27 MED ORDER — DIPHENHYDRAMINE HCL 25 MG PO CAPS
25.0000 mg | ORAL_CAPSULE | Freq: Four times a day (QID) | ORAL | Status: DC | PRN
  Administered 2022-01-27: 25 mg via ORAL
  Filled 2022-01-27: qty 1

## 2022-01-27 MED ORDER — TRAZODONE HCL 50 MG PO TABS
100.0000 mg | ORAL_TABLET | Freq: Every day | ORAL | Status: DC
  Administered 2022-01-27 – 2022-01-29 (×3): 100 mg via ORAL
  Filled 2022-01-27 (×3): qty 2

## 2022-01-27 NOTE — ED Notes (Signed)
Patient is resting comfortably. 

## 2022-01-27 NOTE — ED Notes (Signed)
Pt ambulated to toilet with steady gait 

## 2022-01-27 NOTE — Progress Notes (Signed)
[facility] Progress Note  Patient: Sabrina Barker    PJA:[id number]  DOA: 01/26/2022     Date of Service: the patient was seen and examined on 01/27/2022  Chief Complaint  Patient presents with   Emesis   Diarrhea   Brief hospital course: Sabrina Barker is a 71 y.o. female with medical history significant of seasonal allergies, anxiety, depression, asthma, COPD, GERD, heart murmur, hyperlipidemia, hypertension, osteoarthritis, CAD, history of NSTEMI, lumbar DDD, chronic pain syndrome who is coming to the emergency department from her doctor's office due to nausea, vomiting and diarrhea for the past 3 weeks.  EMS said that her SBP was in the 70s when they arrived.    ED course: Initial vital signs were temperature 97.9 F, pulse 99, respiration 20, BP 132/95 mmHg O2 sat 99% on room air. She received LR 1000 mL bolus ondansetron 4 mg IVP x 1 and KCl 10 mEq IVPB x 4.   Lab work: Her CBC showed a white count 6.8, hemoglobin 16.0 g/dL platelets 341.  Lactic acid was 2.2 mmol/L.  Lipase was normal.  CMP showed sodium 135, potassium 2.3, chloride 98 and CO2 21 mmol/L with an anion gap of 16.  Glucose 139, BUN 37, creatinine 1.77 (her previous creatinine level from 2022 was normal, calcium 8.5 and magnesium 0.9 mg/dL.  Her LFTs showed mild transaminitis with an AST of 43 and ALT 45 units/L.  The rest of the LFTs were normal.   Imaging: CT abdomen/pelvis without contrast show aortic atherosclerosis but no acute intra-abdominal or intrapelvic abnormality.   Assessment and Plan:  # AKI (acute kidney injury) (HCC) Observation/telemetry. Continue IV fluids. Hold ARB/ACE. Hold diuretic. Avoid hypotension. Avoid nephrotoxins. Monitor intake and output. Monitor renal function electrolytes. Creatinine 1.14 gradually improving    Acute gastroenteritis  C.  Diff is still pending. GI pathogen is still pending. Continue IV fluids. Continue symptom management.    Hypokalemia, potassium  repleted and IV fluid.  Continue to monitor and replete as needed.  Hypophosphatemia due to nutritional deficiency. Was repleted.    Chronic pain syndrome Continue analgesics as needed.     Tobacco use  Tobacco cessation advised. Nicotine replacement therapy ordered as needed.     Generalized anxiety disorder Has not been taking medications due to GI upset. If able to tolerate, resume bupropion, buspirone and trazodone in AM.   # CAD s/p stents, recent one in 2022 and Essential hypertension Hold diuretics and ARB. Resume metoprolol and Brilinta home dose      COPD (chronic obstructive pulmonary disease) (HCC) Bronchodilators as needed. Supplemental oxygen as needed.  Body mass index is 28.29 kg/m.  Interventions:       Diet: Regular diet DVT Prophylaxis: Subcutaneous Lovenox   Advance goals of care discussion: Full code  Family Communication: family was NOT present at bedside, at the time of interview.  The pt provided permission to discuss medical plan with the family. Opportunity was given to ask question and all questions were answered satisfactorily.   Disposition:  Pt is from Home, admitted with dehydration due to acute gastroenteritis, hypotensive, still has electrolyte imbalance, which precludes a safe discharge. Discharge to Home, when medically stable most likely in 1 to 2 days.  Subjective: Patient was admitted overnight due to diarrhea, nausea and vomiting and being sick for past 3 weeks.  Today patient is able to tolerate diet, denies any diarrhea since being in the ED.  No nausea vomiting, denies any chest pain or palpitation no shortness of  breath.  Physical Exam: General:  alert oriented to time, place, and person.  Appear in mild distress, affect appropriate Eyes: PERRLA ENT: Oral Mucosa Clear, moist  Neck: no JVD,  Cardiovascular: S1 and S2 Present, no Murmur,  Respiratory: good respiratory effort, Bilateral Air entry equal and Decreased, no  Crackles, no wheezes Abdomen: Bowel Sound present, Soft and no tenderness,  Skin: no rashes Extremities: no Pedal edema, no calf tenderness Neurologic: without any new focal findings Gait not checked due to patient safety concerns  Vitals:   01/27/22 0854 01/27/22 1000 01/27/22 1200 01/27/22 1343  BP: (!) 128/57 (!) 111/56 (!) 141/63 135/65  Pulse: 96 89 92 80  Resp: (!) 22 20 (!) 21 17  Temp:    98 F (36.7 C)  TempSrc:    Oral  SpO2: 99% 97% 97% 98%  Weight:      Height:        Intake/Output Summary (Last 24 hours) at 01/27/2022 1507 Last data filed at 01/27/2022 1205 Gross per 24 hour  Intake 3032.71 ml  Output 300 ml  Net 2732.71 ml   Filed Weights   01/26/22 1522  Weight: 77.1 kg    Data Reviewed: I have personally reviewed and interpreted daily labs, tele strips, imagings as discussed above. I reviewed all nursing notes, pharmacy notes, vitals, pertinent old records I have discussed plan of care as described above with RN and patient/family.  CBC: Recent Labs  Lab 01/26/22 1523 01/27/22 0455  WBC 6.8 6.7  HGB 16.0* 12.8  HCT 45.2 36.5  MCV 84.0 85.3  PLT 302 232   Basic Metabolic Panel: Recent Labs  Lab 01/26/22 1523 01/27/22 0637  NA 135 141  K 2.3* 3.2*  CL 98 106  CO2 21* 26  GLUCOSE 139* 106*  BUN 37* 24*  CREATININE 1.77* 1.14*  CALCIUM 8.5* 7.9*  MG 0.9* 1.4*  PHOS 1.4* 2.1*    Studies: CT ABDOMEN PELVIS WO CONTRAST  Result Date: 01/26/2022 CLINICAL DATA:  Nausea vomiting diarrhea EXAM: CT ABDOMEN AND PELVIS WITHOUT CONTRAST TECHNIQUE: Multidetector CT imaging of the abdomen and pelvis was performed following the standard protocol without IV contrast. RADIATION DOSE REDUCTION: This exam was performed according to the departmental dose-optimization program which includes automated exposure control, adjustment of the mA and/or kV according to patient size and/or use of iterative reconstruction technique. COMPARISON:  None Available.  FINDINGS: Lower chest: Lung bases demonstrate mild subpleural scarring. No acute airspace disease. Calcified benign appearing nodule in the inferior left breast. Dense mitral calcification. Coronary vascular calcification. Hepatobiliary: Status post cholecystectomy. No biliary dilatation. Rim calcified lesion in the right hepatic lobe near gallbladder fossa possibly from old hematoma or trauma. Pancreas: Unremarkable. No pancreatic ductal dilatation or surrounding inflammatory changes. Spleen: Normal in size without focal abnormality. Adrenals/Urinary Tract: Adrenal glands are normal. Kidneys show no hydronephrosis. Right renal cysts, no imaging follow-up is recommended. The bladder is unremarkable. Stomach/Bowel: The stomach is nonenlarged. No dilated small bowel. No acute bowel wall thickening. Vascular/Lymphatic: Nonaneurysmal aorta. Advanced aortic atherosclerosis. No suspicious lymph nodes. Reproductive: Status post hysterectomy. No adnexal masses. Other: Negative for pelvic effusion or free air Musculoskeletal: No acute or suspicious osseous abnormality IMPRESSION: 1. No CT evidence for acute intra-abdominal or pelvic abnormality. 2. Aortic atherosclerosis. Aortic Atherosclerosis (ICD10-I70.0). Electronically Signed   By: Jasmine Pang M.D.   On: 01/26/2022 18:21    Scheduled Meds:  gabapentin  400 mg Oral QHS   pantoprazole  40 mg Oral Daily  ticagrelor  90 mg Oral BID   Continuous Infusions: PRN Meds: acetaminophen **OR** acetaminophen, albuterol, hydrOXYzine, morphine injection, nicotine, ondansetron **OR** ondansetron (ZOFRAN) IV  Time spent: 35 minutes  Author: Gillis Santa. MD [facility] 01/27/2022 3:07 PM  To reach On-call, see care teams to locate the attending and reach out to them via [email or web address]. If 7PM-7AM, please contact night-coverage If you still have difficulty reaching the attending provider, please page the [facility] (Director on Call) for [facility] on amion for  assistance.

## 2022-01-27 NOTE — ED Notes (Signed)
Pt advised her sister Glena Norfolk has been trying to reach her via her personal cell phone, pt was handed her cell phone so she may return her sister's call.

## 2022-01-27 NOTE — ED Notes (Signed)
Chemistry tube sent to lab

## 2022-01-27 NOTE — ED Notes (Signed)
Pt ambulated to toilet and back with steady gait with assistance.

## 2022-01-27 NOTE — ED Notes (Signed)
Lab called Angelica Chessman) to state pt's chemistry tub has "been laying there and is now hemolysis" will need recollect. RN to recollect

## 2022-01-27 NOTE — ED Notes (Signed)
Pt unhooked from monitor so she may use BR, pt ambulated w/o assist, pt also c/o back itching and dry, no rash or redness noted, lotion applied to pt's back for comfort, pt very appreciate.

## 2022-01-27 NOTE — Plan of Care (Signed)

## 2022-01-27 NOTE — ED Notes (Signed)
Pt A&Ox4. New bag of fluids started. Pt stating has lower back pain and that is not uncommon for her. Vitals currently stable. Pt watching television waiting for breakfast tray.

## 2022-01-28 DIAGNOSIS — Z823 Family history of stroke: Secondary | ICD-10-CM | POA: Diagnosis not present

## 2022-01-28 DIAGNOSIS — F32A Depression, unspecified: Secondary | ICD-10-CM | POA: Diagnosis present

## 2022-01-28 DIAGNOSIS — F17210 Nicotine dependence, cigarettes, uncomplicated: Secondary | ICD-10-CM | POA: Diagnosis present

## 2022-01-28 DIAGNOSIS — M1990 Unspecified osteoarthritis, unspecified site: Secondary | ICD-10-CM | POA: Diagnosis present

## 2022-01-28 DIAGNOSIS — M5136 Other intervertebral disc degeneration, lumbar region: Secondary | ICD-10-CM | POA: Diagnosis present

## 2022-01-28 DIAGNOSIS — E785 Hyperlipidemia, unspecified: Secondary | ICD-10-CM | POA: Diagnosis present

## 2022-01-28 DIAGNOSIS — R7401 Elevation of levels of liver transaminase levels: Secondary | ICD-10-CM | POA: Diagnosis present

## 2022-01-28 DIAGNOSIS — E639 Nutritional deficiency, unspecified: Secondary | ICD-10-CM | POA: Diagnosis present

## 2022-01-28 DIAGNOSIS — I252 Old myocardial infarction: Secondary | ICD-10-CM | POA: Diagnosis not present

## 2022-01-28 DIAGNOSIS — E876 Hypokalemia: Secondary | ICD-10-CM | POA: Diagnosis present

## 2022-01-28 DIAGNOSIS — A0811 Acute gastroenteropathy due to Norwalk agent: Secondary | ICD-10-CM | POA: Diagnosis present

## 2022-01-28 DIAGNOSIS — E860 Dehydration: Secondary | ICD-10-CM | POA: Diagnosis present

## 2022-01-28 DIAGNOSIS — N179 Acute kidney failure, unspecified: Secondary | ICD-10-CM | POA: Diagnosis present

## 2022-01-28 DIAGNOSIS — K219 Gastro-esophageal reflux disease without esophagitis: Secondary | ICD-10-CM | POA: Diagnosis present

## 2022-01-28 DIAGNOSIS — J4489 Other specified chronic obstructive pulmonary disease: Secondary | ICD-10-CM | POA: Diagnosis present

## 2022-01-28 DIAGNOSIS — G894 Chronic pain syndrome: Secondary | ICD-10-CM | POA: Diagnosis present

## 2022-01-28 DIAGNOSIS — Z1152 Encounter for screening for COVID-19: Secondary | ICD-10-CM | POA: Diagnosis not present

## 2022-01-28 DIAGNOSIS — R197 Diarrhea, unspecified: Secondary | ICD-10-CM | POA: Diagnosis not present

## 2022-01-28 DIAGNOSIS — I10 Essential (primary) hypertension: Secondary | ICD-10-CM | POA: Diagnosis present

## 2022-01-28 DIAGNOSIS — I2510 Atherosclerotic heart disease of native coronary artery without angina pectoris: Secondary | ICD-10-CM | POA: Diagnosis present

## 2022-01-28 DIAGNOSIS — I959 Hypotension, unspecified: Secondary | ICD-10-CM | POA: Diagnosis present

## 2022-01-28 DIAGNOSIS — J302 Other seasonal allergic rhinitis: Secondary | ICD-10-CM | POA: Diagnosis present

## 2022-01-28 DIAGNOSIS — F411 Generalized anxiety disorder: Secondary | ICD-10-CM | POA: Diagnosis present

## 2022-01-28 LAB — HEPATIC FUNCTION PANEL
ALT: 80 U/L — ABNORMAL HIGH (ref 0–44)
AST: 55 U/L — ABNORMAL HIGH (ref 15–41)
Albumin: 3.5 g/dL (ref 3.5–5.0)
Alkaline Phosphatase: 106 U/L (ref 38–126)
Bilirubin, Direct: <0.1 mg/dL (ref 0.0–0.2)
Total Bilirubin: 0.6 mg/dL (ref 0.3–1.2)
Total Protein: 6.2 g/dL — ABNORMAL LOW (ref 6.5–8.1)

## 2022-01-28 LAB — BASIC METABOLIC PANEL
Anion gap: 7 (ref 5–15)
BUN: 16 mg/dL (ref 8–23)
CO2: 29 mmol/L (ref 22–32)
Calcium: 8.4 mg/dL — ABNORMAL LOW (ref 8.9–10.3)
Chloride: 104 mmol/L (ref 98–111)
Creatinine, Ser: 0.98 mg/dL (ref 0.44–1.00)
GFR, Estimated: >60 mL/min (ref >60)
Glucose, Bld: 81 mg/dL (ref 70–99)
Potassium: 3 mmol/L — ABNORMAL LOW (ref 3.5–5.1)
Sodium: 140 mmol/L (ref 135–145)

## 2022-01-28 LAB — CBC WITH DIFFERENTIAL/PLATELET
Abs Immature Granulocytes: 0.03 10*3/uL (ref 0.00–0.07)
Basophils Absolute: 0 10*3/uL (ref 0.0–0.1)
Basophils Relative: 1 %
Eosinophils Absolute: 0.1 10*3/uL (ref 0.0–0.5)
Eosinophils Relative: 2 %
HCT: 35.4 % — ABNORMAL LOW (ref 36.0–46.0)
Hemoglobin: 12.3 g/dL (ref 12.0–15.0)
Immature Granulocytes: 1 %
Lymphocytes Relative: 40 %
Lymphs Abs: 2.6 10*3/uL (ref 0.7–4.0)
MCH: 29.9 pg (ref 26.0–34.0)
MCHC: 34.7 g/dL (ref 30.0–36.0)
MCV: 86.1 fL (ref 80.0–100.0)
Monocytes Absolute: 0.5 10*3/uL (ref 0.1–1.0)
Monocytes Relative: 7 %
Neutro Abs: 3.2 10*3/uL (ref 1.7–7.7)
Neutrophils Relative %: 49 %
Platelets: 207 10*3/uL (ref 150–400)
RBC: 4.11 MIL/uL (ref 3.87–5.11)
RDW: 12.9 % (ref 11.5–15.5)
WBC: 6.4 10*3/uL (ref 4.0–10.5)
nRBC: 0 % (ref 0.0–0.2)

## 2022-01-28 LAB — MAGNESIUM: Magnesium: 1.8 mg/dL (ref 1.7–2.4)

## 2022-01-28 LAB — PHOSPHORUS: Phosphorus: 4 mg/dL (ref 2.5–4.6)

## 2022-01-28 MED ORDER — ACETAMINOPHEN 650 MG RE SUPP: 650.0000 mg | Freq: Four times a day (QID) | RECTAL | Status: DC | PRN

## 2022-01-28 MED ORDER — HYDROXYZINE HCL 50 MG PO TABS: 25.0000 mg | ORAL_TABLET | Freq: Four times a day (QID) | ORAL | Status: DC | PRN

## 2022-01-28 MED ORDER — TIZANIDINE HCL 4 MG PO TABS
4.0000 mg | ORAL_TABLET | Freq: Four times a day (QID) | ORAL | Status: DC
  Administered 2022-01-28 – 2022-01-30 (×9): 4 mg via ORAL
  Filled 2022-01-28 (×12): qty 1

## 2022-01-28 MED ORDER — ACETAMINOPHEN 325 MG PO TABS
650.0000 mg | ORAL_TABLET | Freq: Four times a day (QID) | ORAL | Status: DC | PRN
  Administered 2022-01-28: 650 mg via ORAL
  Filled 2022-01-28: qty 2

## 2022-01-28 MED ORDER — POTASSIUM CHLORIDE 20 MEQ PO PACK: 40.0000 meq | PACK | Freq: Two times a day (BID) | ORAL | Status: DC

## 2022-01-28 MED ORDER — BUPROPION HCL ER (SR) 150 MG PO TB12
150.0000 mg | ORAL_TABLET | Freq: Every day | ORAL | Status: DC
  Administered 2022-01-28 – 2022-01-30 (×3): 150 mg via ORAL
  Filled 2022-01-28 (×3): qty 1

## 2022-01-28 MED ORDER — POTASSIUM CHLORIDE 10 MEQ/100ML IV SOLN
10.0000 meq | INTRAVENOUS | Status: AC
  Administered 2022-01-28 (×4): 10 meq via INTRAVENOUS
  Filled 2022-01-28 (×4): qty 100

## 2022-01-28 MED ORDER — BUSPIRONE HCL 10 MG PO TABS
10.0000 mg | ORAL_TABLET | Freq: Two times a day (BID) | ORAL | Status: DC | PRN
  Administered 2022-01-29: 10 mg via ORAL
  Filled 2022-01-28: qty 1

## 2022-01-28 MED ORDER — SODIUM CHLORIDE 0.9 % IV SOLN: INTRAVENOUS | Status: AC

## 2022-01-28 MED ORDER — POTASSIUM CHLORIDE CRYS ER 20 MEQ PO TBCR
40.0000 meq | EXTENDED_RELEASE_TABLET | ORAL | Status: AC
  Administered 2022-01-28 (×2): 40 meq via ORAL
  Filled 2022-01-28 (×2): qty 2

## 2022-01-28 NOTE — Progress Notes (Signed)
IVT consult received due to PIV infiltration. Patient has had numerous PIV this hospitalization and they have all infiltrated. If patient is going to remain admitted, please consider PICC line to decrease peripheral vasculature damage.   Arlie Posch [facility] Mast, RN

## 2022-01-28 NOTE — Progress Notes (Addendum)
Nutrition Brief Note  Patient identified on the Malnutrition Screening Tool (MST) Report  Wt Readings from Last 15 Encounters:  01/26/22 77.1 kg  03/21/20 76.5 kg  11/17/16 86.2 kg  11/07/16 86.6 kg  09/21/16 90.7 kg   Pt with medical history significant of seasonal allergies, anxiety, depression, asthma, COPD, GERD, heart murmur, hyperlipidemia, hypertension, osteoarthritis, CAD, history of NSTEMI, lumbar DDD, chronic pain syndrome who is admitted due to nausea, vomiting and diarrhea for the past 3 weeks.   Pt admitted with AKI and acute gastroenteritis.   Spoke with pt at bedside, who reports feeling better since being given IV fluids. Pt reports frustration over being unable to access bed controls so she can eat her lunch; RD assisted pt and provided supportive presence. Per pt, she is anxious that she is in the hospital due to being away from her dog and her sister recovering from a recent surgery at her home. Emotional support provided.   Reviewed wt hx; wt has been stable.   Nutrition-Focused physical exam completed. Findings are no fat depletion, no muscle depletion, and no edema.    Current diet order is regular, patient is consuming approximately n/a% of meals at this time. Labs and medications reviewed.   No nutrition interventions warranted at this time. If nutrition issues arise, please consult RD.   Levada Schilling, RD, LDN, CDCES Registered Dietitian II Certified Diabetes Care and Education Specialist Please refer to [facility] for RD and/or RD on-call/weekend/after hours pager

## 2022-01-28 NOTE — Progress Notes (Signed)
[facility] Progress Note  Patient: Sabrina Barker    [id number]  DOA: 01/26/2022     Date of Service: the patient was seen and examined on 01/28/2022  Chief Complaint  Patient presents with   Emesis   Diarrhea   Brief hospital course: renda Schwalbe is a 71 y.o. female with medical history significant of seasonal allergies, anxiety, depression, asthma, COPD, GERD, heart murmur, hyperlipidemia, hypertension, osteoarthritis, CAD, history of NSTEMI, lumbar DDD, chronic pain syndrome who is coming to the emergency department from her doctor's office due to nausea, vomiting and diarrhea for the past 3 weeks.  EMS said that her SBP was in the 70s when they arrived.    ED course: Initial vital signs were temperature 97.9 F, pulse 99, respiration 20, BP 132/95 mmHg O2 sat 99% on room air. She received LR 1000 mL bolus ondansetron 4 mg IVP x 1 and KCl 10 mEq IVPB x 4.   Lab work: Her CBC showed a white count 6.8, hemoglobin 16.0 g/dL platelets 302.  Lactic acid was 2.2 mmol/L.  Lipase was normal.  CMP showed sodium 135, potassium 2.3, chloride 98 and CO2 21 mmol/L with an anion gap of 16.  Glucose 139, BUN 37, creatinine 1.77 (her previous creatinine level from 2022 was normal, calcium 8.5 and magnesium 0.9 mg/dL.  Her LFTs showed mild transaminitis with an AST of 43 and ALT 45 units/L.  The rest of the LFTs were normal.   Imaging: CT abdomen/pelvis without contrast show aortic atherosclerosis but no acute intra-abdominal or intrapelvic abnormality.   Assessment and Plan:  # AKI (acute kidney injury) resolved Observation/telemetry. Continue IV fluids. Hold ARB/ACE. Hold diuretic. Avoid hypotension. Avoid nephrotoxins. Monitor intake and output. Monitor renal function electrolytes. Creatinine 1.14 -- 0.98 improved    Acute gastroenteritis  C.  Diff is still pending. GI pathogen is still pending. Continue IV fluids. Continue symptom management. 12/22 no more diarrhea, patient  had 1 soft BM last night, no nausea vomiting   Hypokalemia, potassium repleted IV and p.o., Continue to monitor and replete as needed.  Hypophosphatemia due to nutritional deficiency.  Repleted and resolved.    Chronic pain syndrome Continue analgesics as needed.     Tobacco use  Tobacco cessation advised. Nicotine replacement therapy ordered as needed.     Generalized anxiety disorder resumed bupropion, buspirone and trazodone     # CAD s/p stents, recent one in 2022 and Essential hypertension Hold diuretics and ARB. Resume metoprolol and Brilinta home dose      COPD (chronic obstructive pulmonary disease) (HCC) Bronchodilators as needed. Supplemental oxygen as needed.  Body mass index is 28.29 kg/m.  Interventions:       Diet: Regular diet DVT Prophylaxis: Subcutaneous Lovenox   Advance goals of care discussion: Full code  Family Communication: family was NOT present at bedside, at the time of interview.  The pt provided permission to discuss medical plan with the family. Opportunity was given to ask question and all questions were answered satisfactorily.   Disposition:  Pt is from Home, admitted with dehydration due to acute gastroenteritis, hypotensive, still has hypokalemia, which precludes a safe discharge. Discharge to Home, when medically stable most likely tomorrow a.m.   Subjective: No significant overnight events, overall patient feels improvement.  Had 1 BM soft stool last night, no diarrhea, denies any nausea vomiting.  Patient is able to tolerate diet.    Physical Exam: General:  alert oriented to time, place, and person.  Appear in mild  distress, affect appropriate Eyes: PERRLA ENT: Oral Mucosa Clear, moist  Neck: no JVD,  Cardiovascular: S1 and S2 Present, no Murmur,  Respiratory: good respiratory effort, Bilateral Air entry equal and Decreased, no Crackles, no wheezes Abdomen: Bowel Sound present, Soft and no tenderness,  Skin: no  rashes Extremities: no Pedal edema, no calf tenderness Neurologic: without any new focal findings Gait not checked due to patient safety concerns  Vitals:   01/27/22 2118 01/28/22 0201 01/28/22 0525 01/28/22 0832  BP: (!) 140/70 115/61 120/63 127/79  Pulse: 74 66 80 71  Resp: 16 16 20 16   Temp: 98.4 F (36.9 C) 97.9 F (36.6 C) 98 F (36.7 C) 97.9 F (36.6 C)  TempSrc:    Oral  SpO2: 99% 96% 97% 98%  Weight:      Height:        Intake/Output Summary (Last 24 hours) at 01/28/2022 1212 Last data filed at 01/27/2022 1821 Gross per 24 hour  Intake 2.17 ml  Output --  Net 2.17 ml   Filed Weights   01/26/22 1522  Weight: 77.1 kg    Data Reviewed: I have personally reviewed and interpreted daily labs, tele strips, imagings as discussed above. I reviewed all nursing notes, pharmacy notes, vitals, pertinent old records I have discussed plan of care as described above with RN and patient/family.  CBC: Recent Labs  Lab 01/26/22 1523 01/27/22 0455 01/28/22 0305  WBC 6.8 6.7 6.4  NEUTROABS  --   --  3.2  HGB 16.0* 12.8 12.3  HCT 45.2 36.5 35.4*  MCV 84.0 85.3 86.1  PLT 302 232 207   Basic Metabolic Panel: Recent Labs  Lab 01/26/22 1523 01/27/22 0637 01/28/22 0305  NA 135 141 140  K 2.3* 3.2* 3.0*  CL 98 106 104  CO2 21* 26 29  GLUCOSE 139* 106* 81  BUN 37* 24* 16  CREATININE 1.77* 1.14* 0.98  CALCIUM 8.5* 7.9* 8.4*  MG 0.9* 1.4* 1.8  PHOS 1.4* 2.1* 4.0    Studies: No results found.  Scheduled Meds:  buPROPion  150 mg Oral Daily   enoxaparin (LOVENOX) injection  40 mg Subcutaneous Q24H   gabapentin  400 mg Oral QHS   metoprolol succinate  25 mg Oral Daily   pantoprazole  40 mg Oral Daily   potassium chloride  40 mEq Oral Q4H   ticagrelor  90 mg Oral BID   tiZANidine  4 mg Oral QID   traZODone  100 mg Oral QHS   Continuous Infusions:  sodium chloride 50 mL/hr at 01/28/22 0925   potassium chloride 10 mEq (01/28/22 1157)   PRN Meds: acetaminophen  **OR** acetaminophen, albuterol, busPIRone, diphenhydrAMINE, HYDROcodone-acetaminophen, hydrOXYzine, nicotine, ondansetron **OR** ondansetron (ZOFRAN) IV, prochlorperazine  Time spent: 50 minutes  Author: 01/30/22. MD [facility] 01/28/2022 12:12 PM  To reach On-call, see care teams to locate the attending and reach out to them via [email or web address]. If 7PM-7AM, please contact night-coverage If you still have difficulty reaching the attending provider, please page the [facility] (Director on Call) for [facility] on amion for assistance.

## 2022-01-28 NOTE — Progress Notes (Signed)
  Transition of Care (TOC) Screening Note   Patient Details  Name: Sabrina Barker Date of Birth: 06/27/1950   Transition [facility] CM/SW Contact:    Colin Broach, LCSW Phone Number: 01/28/2022, 9:21 AM    Transition of Care Department [facility]) has reviewed patient and no TOC needs have been identified at this time. We will continue to monitor patient advancement through interdisciplinary progression rounds. If new patient transition needs arise, please place a TOC consult.

## 2022-01-29 DIAGNOSIS — N179 Acute kidney failure, unspecified: Secondary | ICD-10-CM | POA: Diagnosis not present

## 2022-01-29 LAB — HEPATIC FUNCTION PANEL
ALT: 54 U/L — ABNORMAL HIGH (ref 0–44)
AST: 29 U/L (ref 15–41)
Albumin: 3.1 g/dL — ABNORMAL LOW (ref 3.5–5.0)
Alkaline Phosphatase: 78 U/L (ref 38–126)
Bilirubin, Direct: <0.1 mg/dL (ref 0.0–0.2)
Total Bilirubin: 0.6 mg/dL (ref 0.3–1.2)
Total Protein: 5.5 g/dL — ABNORMAL LOW (ref 6.5–8.1)

## 2022-01-29 LAB — GASTROINTESTINAL PANEL BY PCR, STOOL (REPLACES STOOL CULTURE)

## 2022-01-29 LAB — BASIC METABOLIC PANEL
Anion gap: 5 (ref 5–15)
BUN: 16 mg/dL (ref 8–23)
CO2: 27 mmol/L (ref 22–32)
Calcium: 8.1 mg/dL — ABNORMAL LOW (ref 8.9–10.3)
Chloride: 109 mmol/L (ref 98–111)
Creatinine, Ser: 0.88 mg/dL (ref 0.44–1.00)
GFR, Estimated: >60 mL/min (ref >60)
Glucose, Bld: 85 mg/dL (ref 70–99)
Potassium: 3.7 mmol/L (ref 3.5–5.1)
Sodium: 141 mmol/L (ref 135–145)

## 2022-01-29 LAB — PHOSPHORUS: Phosphorus: 1.9 mg/dL — ABNORMAL LOW (ref 2.5–4.6)

## 2022-01-29 LAB — MAGNESIUM: Magnesium: 1.4 mg/dL — ABNORMAL LOW (ref 1.7–2.4)

## 2022-01-29 MED ORDER — RISAQUAD PO CAPS
1.0000 | ORAL_CAPSULE | Freq: Every day | ORAL | Status: DC
  Administered 2022-01-29 – 2022-01-30 (×2): 1 via ORAL
  Filled 2022-01-29 (×2): qty 1

## 2022-01-29 MED ORDER — MAGNESIUM SULFATE 4 GM/100ML IV SOLN
4.0000 g | Freq: Once | INTRAVENOUS | Status: AC
  Administered 2022-01-29: 4 g via INTRAVENOUS
  Filled 2022-01-29: qty 100

## 2022-01-29 MED ORDER — POTASSIUM PHOSPHATES 15 MMOLE/5ML IV SOLN
30.0000 mmol | Freq: Once | INTRAVENOUS | Status: AC
  Administered 2022-01-29: 30 mmol via INTRAVENOUS
  Filled 2022-01-29: qty 10

## 2022-01-29 NOTE — Progress Notes (Signed)
[facility] Progress Note  Patient: Sabrina Barker    HOZ:[id number]  DOA: 01/26/2022     Date of Service: the patient was seen and examined on 01/29/2022  Chief Complaint  Patient presents with   Emesis   Diarrhea   Brief hospital course: Sabrina Barker is a 71 y.o. female with medical history significant of seasonal allergies, anxiety, depression, asthma, COPD, GERD, heart murmur, hyperlipidemia, hypertension, osteoarthritis, CAD, history of NSTEMI, lumbar DDD, chronic pain syndrome who is coming to the emergency department from her doctor's office due to nausea, vomiting and diarrhea for the past 3 weeks.  EMS said that her SBP was in the 70s when they arrived.    ED course: Initial vital signs were temperature 97.9 F, pulse 99, respiration 20, BP 132/95 mmHg O2 sat 99% on room air. She received LR 1000 mL bolus ondansetron 4 mg IVP x 1 and KCl 10 mEq IVPB x 4.   Lab work: Her CBC showed a white count 6.8, hemoglobin 16.0 g/dL platelets 704.  Lactic acid was 2.2 mmol/L.  Lipase was normal.  CMP showed sodium 135, potassium 2.3, chloride 98 and CO2 21 mmol/L with an anion gap of 16.  Glucose 139, BUN 37, creatinine 1.77 (her previous creatinine level from 2022 was normal, calcium 8.5 and magnesium 0.9 mg/dL.  Her LFTs showed mild transaminitis with an AST of 43 and ALT 45 units/L.  The rest of the LFTs were normal.   Imaging: CT abdomen/pelvis without contrast show aortic atherosclerosis but no acute intra-abdominal or intrapelvic abnormality.   Assessment and Plan:  # AKI (acute kidney injury) resolved Observation/telemetry. Continue IV fluids. Hold ARB/ACE. Hold diuretic. Avoid hypotension. Avoid nephrotoxins. Monitor intake and output. Monitor renal function electrolytes. Creatinine 1.14 -- 0.88 improved    Acute gastroenteritis  C.  Diff is still pending. GI pathogen is still pending. Continue IV fluids. Continue symptom management. 12/22 no more diarrhea, patient  had 1 soft BM last night, no nausea vomiting 12/23 again developed diarrhea 4 episodes, 2 were small and 2 were large liquidy.   Started lactobacillus   Hypokalemia, potassium repleted IV and p.o., Continue to monitor and replete as needed.  Hypophosphatemia due to nutritional deficiency.  Monitor and replete as needed  Hypomagnesemia, mag repleted.  Monitor and replete as needed  Chronic pain syndrome Continue analgesics as needed.   Tobacco use  Tobacco cessation advised. Nicotine replacement therapy ordered as needed.    Generalized anxiety disorder resumed bupropion, buspirone and trazodone     # CAD s/p stents, recent one in 2022 and Essential hypertension Hold diuretics and ARB. Resume metoprolol and Brilinta home dose    COPD (chronic obstructive pulmonary disease) (HCC) Bronchodilators as needed. Supplemental oxygen as needed.  Body mass index is 28.29 kg/m.  Interventions:       Diet: Regular diet DVT Prophylaxis: Subcutaneous Lovenox   Advance goals of care discussion: Full code  Family Communication: family was NOT present at bedside, at the time of interview.  The pt provided permission to discuss medical plan with the family. Opportunity was given to ask question and all questions were answered satisfactorily.   Disposition:  Pt is from Home, admitted with dehydration due to acute gastroenteritis, hypotensive, still has diarrhea, low Phos and low magnesium, which precludes a safe discharge. Discharge to Home, when medically stable most likely tomorrow a.m.   Subjective: No significant overnight events, early morning patient developed 4 episodes of diarrhea 2 episodes where soft and small but  later on she had 2 episodes of significant loose watery diarrhea large-volume.  Denies any abdominal pain, no nausea vomiting, tolerating diet well. Denies any chest pain or palpitation, no shortness of breath.    Physical Exam: General:  alert oriented to time,  place, and person.  Appear in mild distress, affect appropriate Eyes: PERRLA ENT: Oral Mucosa Clear, moist  Neck: no JVD,  Cardiovascular: S1 and S2 Present, no Murmur,  Respiratory: good respiratory effort, Bilateral Air entry equal and Decreased, no Crackles, no wheezes Abdomen: Bowel Sound present, Soft and no tenderness,  Skin: no rashes Extremities: no Pedal edema, no calf tenderness Neurologic: without any new focal findings Gait not checked due to patient safety concerns  Vitals:   01/29/22 0455 01/29/22 0457 01/29/22 0838 01/29/22 1217  BP: (!) 112/53  137/61 (!) 152/62  Pulse: (!) 46 (!) 59 (!) 53 (!) 59  Resp: 16  20 19   Temp: (!) 97.5 F (36.4 C)  98.1 F (36.7 C) 98.3 F (36.8 C)  TempSrc:   Oral   SpO2: 99% 99% 100% 100%  Weight:      Height:        Intake/Output Summary (Last 24 hours) at 01/29/2022 1329 Last data filed at 01/29/2022 1038 Gross per 24 hour  Intake 1015.94 ml  Output --  Net 1015.94 ml   Filed Weights   01/26/22 1522  Weight: 77.1 kg    Data Reviewed: I have personally reviewed and interpreted daily labs, tele strips, imagings as discussed above. I reviewed all nursing notes, pharmacy notes, vitals, pertinent old records I have discussed plan of care as described above with RN and patient/family.  CBC: Recent Labs  Lab 01/26/22 1523 01/27/22 0455 01/28/22 0305  WBC 6.8 6.7 6.4  NEUTROABS  --   --  3.2  HGB 16.0* 12.8 12.3  HCT 45.2 36.5 35.4*  MCV 84.0 85.3 86.1  PLT 302 232 207   Basic Metabolic Panel: Recent Labs  Lab 01/26/22 1523 01/27/22 0637 01/28/22 0305 01/29/22 0538  NA 135 141 140 141  K 2.3* 3.2* 3.0* 3.7  CL 98 106 104 109  CO2 21* 26 29 27   GLUCOSE 139* 106* 81 85  BUN 37* 24* 16 16  CREATININE 1.77* 1.14* 0.98 0.88  CALCIUM 8.5* 7.9* 8.4* 8.1*  MG 0.9* 1.4* 1.8 1.4*  PHOS 1.4* 2.1* 4.0 1.9*    Studies: No results found.  Scheduled Meds:  acidophilus  1 capsule Oral Daily   buPROPion  150 mg  Oral Daily   enoxaparin (LOVENOX) injection  40 mg Subcutaneous Q24H   gabapentin  400 mg Oral QHS   metoprolol succinate  25 mg Oral Daily   pantoprazole  40 mg Oral Daily   ticagrelor  90 mg Oral BID   tiZANidine  4 mg Oral QID   traZODone  100 mg Oral QHS   Continuous Infusions:  potassium PHOSPHATE IVPB (in mmol) 30 mmol (01/29/22 1146)   PRN Meds: acetaminophen **OR** acetaminophen, albuterol, busPIRone, diphenhydrAMINE, HYDROcodone-acetaminophen, hydrOXYzine, nicotine, ondansetron **OR** ondansetron (ZOFRAN) IV, prochlorperazine  Time spent: 50 minutes  Author: . MD [facility] 01/29/2022 1:29 PM  To reach On-call, see care teams to locate the attending and reach out to them via [email or web address]. If 7PM-7AM, please contact night-coverage If you still have difficulty reaching the attending provider, please page the [facility] (Director on Call) for [facility] on amion for assistance.

## 2022-01-29 NOTE — Progress Notes (Signed)
Mobility Specialist - Progress Note   01/29/22 0905  Mobility  Activity Ambulated independently in hallway;Stood at bedside;Dangled on edge of bed  Level of Assistance Independent  Assistive Device None  Distance Ambulated (ft) 160 ft  Activity Response Tolerated well  Mobility Referral Yes  $Mobility charge 1 Mobility   Pt supine in bed on RA upon arrival. Pt STS and ambulates 1 lap around NS indep. Pt returns to bed with needs in reach.  Terrilyn Saver  Mobility Specialist  01/29/22 9:06 AM

## 2022-01-30 DIAGNOSIS — E876 Hypokalemia: Secondary | ICD-10-CM

## 2022-01-30 DIAGNOSIS — A0811 Acute gastroenteropathy due to Norwalk agent: Secondary | ICD-10-CM

## 2022-01-30 DIAGNOSIS — R197 Diarrhea, unspecified: Secondary | ICD-10-CM

## 2022-01-30 DIAGNOSIS — E860 Dehydration: Secondary | ICD-10-CM

## 2022-01-30 HISTORY — DX: Acute gastroenteropathy due to Norwalk agent: A08.11

## 2022-01-30 HISTORY — DX: Hypokalemia: E87.6

## 2022-01-30 HISTORY — DX: Hypomagnesemia: E83.42

## 2022-01-30 LAB — BASIC METABOLIC PANEL
Anion gap: 6 (ref 5–15)
BUN: 11 mg/dL (ref 8–23)
CO2: 25 mmol/L (ref 22–32)
Calcium: 8 mg/dL — ABNORMAL LOW (ref 8.9–10.3)
Chloride: 109 mmol/L (ref 98–111)
Creatinine, Ser: 0.79 mg/dL (ref 0.44–1.00)
GFR, Estimated: >60 mL/min (ref >60)
Glucose, Bld: 90 mg/dL (ref 70–99)
Potassium: 3.9 mmol/L (ref 3.5–5.1)
Sodium: 140 mmol/L (ref 135–145)

## 2022-01-30 LAB — HEPATIC FUNCTION PANEL
ALT: 42 U/L (ref 0–44)
AST: 22 U/L (ref 15–41)
Albumin: 2.9 g/dL — ABNORMAL LOW (ref 3.5–5.0)
Alkaline Phosphatase: 73 U/L (ref 38–126)
Bilirubin, Direct: <0.1 mg/dL (ref 0.0–0.2)
Total Bilirubin: 0.7 mg/dL (ref 0.3–1.2)
Total Protein: 5.3 g/dL — ABNORMAL LOW (ref 6.5–8.1)

## 2022-01-30 LAB — MAGNESIUM: Magnesium: 2 mg/dL (ref 1.7–2.4)

## 2022-01-30 LAB — PHOSPHORUS: Phosphorus: 2.8 mg/dL (ref 2.5–4.6)

## 2022-01-30 MED ORDER — METOPROLOL SUCCINATE ER 25 MG PO TB24: 12.5000 mg | ORAL_TABLET | Freq: Every day | ORAL | 0 refills | Status: DC

## 2022-01-30 MED ORDER — ONDANSETRON HCL 4 MG PO TABS: 4.0000 mg | ORAL_TABLET | Freq: Every day | ORAL | 0 refills | Status: AC | PRN

## 2022-01-30 MED ORDER — LOSARTAN POTASSIUM 25 MG PO TABS: 25.0000 mg | ORAL_TABLET | Freq: Every day | ORAL | 0 refills | Status: DC

## 2022-01-30 MED ORDER — RISAQUAD PO CAPS: 1.0000 | ORAL_CAPSULE | Freq: Every day | ORAL | 0 refills | Status: AC

## 2022-01-30 NOTE — Discharge Summary (Signed)
Physician Discharge Summary   Patient: Sabrina Barker MRN: [id number] DOB: [birth year]  Admit date:     01/26/2022  Discharge date: 01/30/22  Discharge Physician: Thomas Hoff   PCP: Miki Kins, FNP   Recommendations at discharge:    Increase fluids and hydrate well BLAND Diet x 2weeks  Discharge Diagnoses: Principal Problem:   AKI (acute kidney injury) (HCC) Active Problems:   Chronic pain syndrome   Generalized anxiety disorder   Essential hypertension   COPD (chronic obstructive pulmonary disease) (HCC)   Acute gastroenteritis   Tobacco use   Dehydration   Diarrhea   Hypokalemia   Hypomagnesemia   Norovirus   Enteritis due to Norovirus  Resolved Problems:   * No resolved hospital problems. [facility] Course: No notes on file  Assessment and Plan: No notes have been filed under this hospital service. Service: Hospitalist  71 y.o. F h/o seasonal allergies, anxiety, depression, asthma, COPD, GERD, heart murmur, hyperlipidemia, hypertension, osteoarthritis, CAD, history of NSTEMI, lumbar DDD, chronic pain syndrome presented to hospital with N/V diarrhea and found to norovirus. Reports no exposure to ill contacts.  Imaging: CT abdomen/pelvis without contrast show aortic atherosclerosis but no acute intra-abdominal or intrapelvic abnormality.  # AKI (acute kidney injury) resolved Resolved with supportive care, now at baseline. Making excellent urine, tolerating good PO intake Can restart losartan at DC but hold HCTZ and aldactone, will need to discuss with PCP in 1 week for medication adjustment and REPEAT BMP  Acute gastroenteritis  C.  Diff negative GI pathogen Positive for norovirus Discussed bland diet, probiotics    Hypokalemia, potassium repleted IV and p.o., Continue to monitor and replete as needed.   Hypophosphatemia due to nutritional deficiency.  Monitor and replete as needed   Hypomagnesemia: mag repleted.  Monitor and replete as needed    Chronic pain syndrome: Continue analgesics as needed.   Tobacco use: Tobacco cessation advised. Nicotine replacement therapy ordered as needed.   Generalized anxiety disorder: resumed bupropion, buspirone and trazodone     # CAD s/p stents, recent one in 2022 and Essential hypertension Restart losartan Resume metoprolol and Brilinta home dose     COPD (chronic obstructive pulmonary disease) (HCC) Bronchodilators as needed. Supplemental oxygen as needed.   Body mass index is 28.29 kg/m.  Interventions:     Pain control - [employer] Controlled Substance Reporting System database was reviewed. and patient was instructed, not to drive, operate heavy machinery, perform activities at heights, swimming or participation in water activities or provide baby-sitting services while on Pain, Sleep and Anxiety Medications; until their outpatient Physician has advised to do so again. Also recommended to not to take more than prescribed Pain, Sleep and Anxiety Medications.  Consultants: n/a Procedures performed: n/a  Disposition: Home Diet recommendation:  BLAND DIET DISCHARGE MEDICATION: Allergies as of 01/30/2022       Reactions   Iodinated Contrast Media Anaphylaxis   Swelling and can't breath   Penicillins Shortness Of Breath, Swelling   Patient states "I pass out"  EMS is normally called.    Penicillins Anaphylaxis   Shellfish Allergy Anaphylaxis   Iodine fine topically.  Can not use ivp dye        Medication List     STOP taking these medications    furosemide 20 MG tablet Commonly known as: LASIX   losartan-hydrochlorothiazide 100-25 MG tablet Commonly known as: HYZAAR   spironolactone 25 MG tablet Commonly known as: ALDACTONE       TAKE  these medications    acidophilus Caps capsule Take 1 capsule by mouth daily. Start taking on: January 31, 2022   atorvastatin 80 MG tablet Commonly known as: LIPITOR Take 1 tablet (80 mg total) by mouth daily.    buPROPion 150 MG 12 hr tablet Commonly known as: WELLBUTRIN SR Take 150 mg by mouth daily.   busPIRone 10 MG tablet Commonly known as: BUSPAR Take 10 mg by mouth 2 (two) times daily as needed.   gabapentin 400 MG capsule Commonly known as: NEURONTIN Take 400 mg by mouth at bedtime.   HYDROcodone-acetaminophen 5-325 MG tablet Commonly known as: NORCO/VICODIN Take 1 tablet by mouth 2 (two) times daily as needed for severe pain.   losartan 25 MG tablet Commonly known as: Cozaar Take 1 tablet (25 mg total) by mouth daily.   metoprolol succinate 25 MG 24 hr tablet Commonly known as: TOPROL-XL Take 0.5 tablets (12.5 mg total) by mouth daily. What changed: how much to take   omeprazole 40 MG capsule Commonly known as: PRILOSEC Take 40 mg by mouth daily.   ondansetron 4 MG tablet Commonly known as: Zofran Take 1 tablet (4 mg total) by mouth daily as needed for up to 5 days for nausea or vomiting.   ticagrelor 90 MG Tabs tablet Commonly known as: BRILINTA Take 1 tablet (90 mg total) by mouth 2 (two) times daily.   tiZANidine 4 MG tablet Commonly known as: ZANAFLEX Take 4 mg by mouth 4 (four) times daily.   traMADol 50 MG tablet Commonly known as: ULTRAM Take 1 tablet (50 mg total) by mouth every 6 (six) hours as needed for severe pain.   traZODone 100 MG tablet Commonly known as: DESYREL Take 100 mg by mouth at bedtime.   Ventolin HFA 108 (90 Base) MCG/ACT inhaler Generic drug: albuterol Take 2 puffs by mouth every 4 (four) hours as needed for wheezing or shortness of breath.        Follow-up Information     Miki KinsShirley, Amanda M, FNP Follow up in 1 week(s).   Specialty: Family Medicine Contact information: [address] [place] [address] [phone]                Discharge Exam: Ceasar MonsFiled Weights   01/26/22 1522  Weight: 77.1 kg     General:  alert oriented to time, place, and person.  Appear in mild distress, affect appropriate Eyes:  PERRLA ENT: Oral Mucosa Clear, moist  Neck: no JVD,  Cardiovascular: S1 and S2 Present, no Murmur,  Respiratory: good respiratory effort, Bilateral Air entry equal and Decreased, no Crackles, no wheezes Abdomen: Bowel Sound present, Soft and no tenderness,  Skin: no rashes Extremities: no Pedal edema, no calf tenderness Neurologic: without any new focal findings Gait not checked due to patient safety concerns    Condition at discharge: good  The results of significant diagnostics from this hospitalization (including imaging, microbiology, ancillary and laboratory) are listed below for reference.   Imaging Studies: CT ABDOMEN PELVIS WO CONTRAST  Result Date: 01/26/2022 CLINICAL DATA:  Nausea vomiting diarrhea EXAM: CT ABDOMEN AND PELVIS WITHOUT CONTRAST TECHNIQUE: Multidetector CT imaging of the abdomen and pelvis was performed following the standard protocol without IV contrast. RADIATION DOSE REDUCTION: This exam was performed according to the departmental dose-optimization program which includes automated exposure control, adjustment of the mA and/or kV according to patient size and/or use of iterative reconstruction technique. COMPARISON:  None Available. FINDINGS: Lower chest: Lung bases demonstrate mild subpleural scarring. No acute  airspace disease. Calcified benign appearing nodule in the inferior left breast. Dense mitral calcification. Coronary vascular calcification. Hepatobiliary: Status post cholecystectomy. No biliary dilatation. Rim calcified lesion in the right hepatic lobe near gallbladder fossa possibly from old hematoma or trauma. Pancreas: Unremarkable. No pancreatic ductal dilatation or surrounding inflammatory changes. Spleen: Normal in size without focal abnormality. Adrenals/Urinary Tract: Adrenal glands are normal. Kidneys show no hydronephrosis. Right renal cysts, no imaging follow-up is recommended. The bladder is unremarkable. Stomach/Bowel: The stomach is nonenlarged.  No dilated small bowel. No acute bowel wall thickening. Vascular/Lymphatic: Nonaneurysmal aorta. Advanced aortic atherosclerosis. No suspicious lymph nodes. Reproductive: Status post hysterectomy. No adnexal masses. Other: Negative for pelvic effusion or free air Musculoskeletal: No acute or suspicious osseous abnormality IMPRESSION: 1. No CT evidence for acute intra-abdominal or pelvic abnormality. 2. Aortic atherosclerosis. Aortic Atherosclerosis (ICD10-I70.0). Electronically Signed   By: Jasmine Pang M.D.   On: 01/26/2022 18:21    Microbiology: Results for orders placed or performed during the hospital encounter of 01/26/22  Resp panel by RT-PCR (RSV, Flu A&B, Covid) Anterior Nasal Swab     Status: None   Collection Time: 01/26/22  6:57 PM   Specimen: Anterior Nasal Swab  Result Value Ref Range Status   SARS Coronavirus 2 by RT PCR NEGATIVE NEGATIVE Final    Comment: (NOTE) SARS-CoV-2 target nucleic acids are NOT DETECTED.  The SARS-CoV-2 RNA is generally detectable in upper respiratory specimens during the acute phase of infection. The lowest concentration of SARS-CoV-2 viral copies this assay can detect is 138 copies/mL. A negative result does not preclude SARS-Cov-2 infection and should not be used as the sole basis for treatment or other patient management decisions. A negative result may occur with  improper specimen collection/handling, submission of specimen other than nasopharyngeal swab, presence of viral mutation(s) within the areas targeted by this assay, and inadequate number of viral copies(<138 copies/mL). A negative result must be combined with clinical observations, patient history, and epidemiological information. The expected result is Negative.  Fact Sheet for Patients:  BloggerCourse.com  Fact Sheet for Healthcare Providers:  SeriousBroker.it  This test is no t yet approved or cleared by the [place] FDA and   has been authorized for detection and/or diagnosis of SARS-CoV-2 by FDA under an Emergency Use Authorization (EUA). This EUA will remain  in effect (meaning this test can be used) for the duration of the COVID-19 declaration under Section 564(b)(1) of the Act, 21 U.S.C.section 360bbb-3(b)(1), unless the authorization is terminated  or revoked sooner.       Influenza A by PCR NEGATIVE NEGATIVE Final   Influenza B by PCR NEGATIVE NEGATIVE Final    Comment: (NOTE) The Xpert Xpress SARS-CoV-2/FLU/RSV plus assay is intended as an aid in the diagnosis of influenza from Nasopharyngeal swab specimens and should not be used as a sole basis for treatment. Nasal washings and aspirates are unacceptable for Xpert Xpress SARS-CoV-2/FLU/RSV testing.  Fact Sheet for Patients: BloggerCourse.com  Fact Sheet for Healthcare Providers: SeriousBroker.it  This test is not yet approved or cleared by the [place] FDA and has been authorized for detection and/or diagnosis of SARS-CoV-2 by FDA under an Emergency Use Authorization (EUA). This EUA will remain in effect (meaning this test can be used) for the duration of the COVID-19 declaration under Section 564(b)(1) of the Act, 21 U.S.C. section 360bbb-3(b)(1), unless the authorization is terminated or revoked.     Resp Syncytial Virus by PCR NEGATIVE NEGATIVE Final    Comment: (NOTE) Fact  Sheet for Patients: BloggerCourse.com  Fact Sheet for Healthcare Providers: SeriousBroker.it  This test is not yet approved or cleared by the [place] FDA and has been authorized for detection and/or diagnosis of SARS-CoV-2 by FDA under an Emergency Use Authorization (EUA). This EUA will remain in effect (meaning this test can be used) for the duration of the COVID-19 declaration under Section 564(b)(1) of the Act, 21 U.S.C. section 360bbb-3(b)(1),  unless the authorization is terminated or revoked.  Performed at [facility], [address] [place], [place] [address]   Gastrointestinal Panel by PCR , Stool     Status: Abnormal   Collection Time: 01/29/22 12:50 PM   Specimen: Stool  Result Value Ref Range Status   Campylobacter species NOT DETECTED NOT DETECTED Final   Plesimonas shigelloides NOT DETECTED NOT DETECTED Final   Salmonella species NOT DETECTED NOT DETECTED Final   Yersinia enterocolitica NOT DETECTED NOT DETECTED Final   Vibrio species NOT DETECTED NOT DETECTED Final   Vibrio cholerae NOT DETECTED NOT DETECTED Final   Enteroaggregative E coli (EAEC) NOT DETECTED NOT DETECTED Final   Enteropathogenic E coli (EPEC) NOT DETECTED NOT DETECTED Final   Enterotoxigenic E coli (ETEC) NOT DETECTED NOT DETECTED Final   Shiga like toxin producing E coli (STEC) NOT DETECTED NOT DETECTED Final   Shigella/Enteroinvasive E coli (EIEC) NOT DETECTED NOT DETECTED Final   Cryptosporidium NOT DETECTED NOT DETECTED Final   Cyclospora cayetanensis NOT DETECTED NOT DETECTED Final   Entamoeba histolytica NOT DETECTED NOT DETECTED Final   Giardia lamblia NOT DETECTED NOT DETECTED Final   Adenovirus F40/41 NOT DETECTED NOT DETECTED Final   Astrovirus NOT DETECTED NOT DETECTED Final   Norovirus GI/GII DETECTED (A) NOT DETECTED Final    Comment: RESULT CALLED TO, READ BACK BY AND VERIFIED WITH: C/KRISTYN DAVIS 01/29/22 1552 SLM    Rotavirus A NOT DETECTED NOT DETECTED Final   Sapovirus (I, II, IV, and V) NOT DETECTED NOT DETECTED Final    Comment: Performed at [facility], [address] [place], [place] [address]    Labs: CBC: Recent Labs  Lab 01/26/22 1523 01/27/22 0455 01/28/22 0305  WBC 6.8 6.7 6.4  NEUTROABS  --   --  3.2  HGB 16.0* 12.8 12.3  HCT 45.2 36.5 35.4*  MCV 84.0 85.3 86.1  PLT 302 232 207   Basic Metabolic Panel: Recent Labs  Lab 01/26/22 1523 01/27/22 0637 01/28/22 0305  01/29/22 0538 01/30/22 0414  NA 135 141 140 141 140  K 2.3* 3.2* 3.0* 3.7 3.9  CL 98 106 104 109 109  CO2 21* 26 29 27 25   GLUCOSE 139* 106* 81 85 90  BUN 37* 24* 16 16 11   CREATININE 1.77* 1.14* 0.98 0.88 0.79  CALCIUM 8.5* 7.9* 8.4* 8.1* 8.0*  MG 0.9* 1.4* 1.8 1.4* 2.0  PHOS 1.4* 2.1* 4.0 1.9* 2.8   Liver Function Tests: Recent Labs  Lab 01/26/22 1523 01/27/22 0637 01/28/22 0305 01/29/22 0538 01/30/22 0414  AST 43* 115* 55* 29 22  ALT 45* 108* 80* 54* 42  ALKPHOS 52 119 106 78 73  BILITOT 1.2 0.7 0.6 0.6 0.7  PROT 7.4 6.1* 6.2* 5.5* 5.3*  ALBUMIN 4.1 3.2* 3.5 3.1* 2.9*   CBG: No results for input(s): "GLUCAP" in the last 168 hours.  Discharge time spent: greater than 30 minutes.  Signed: 01/31/22, MD [facility] 01/30/2022

## 2022-01-30 NOTE — Progress Notes (Signed)
Patient discharged home. Peripheral IV removed and all belongings sent home with patient. All discharge instructions provided and questions answered.

## 2022-03-18 ENCOUNTER — Other Ambulatory Visit: Payer: Self-pay

## 2022-03-18 MED ORDER — HYDROCODONE-ACETAMINOPHEN 5-325 MG PO TABS: 1.0000 | ORAL_TABLET | Freq: Two times a day (BID) | ORAL | 0 refills | Status: DC | PRN

## 2022-03-21 ENCOUNTER — Other Ambulatory Visit: Payer: Self-pay | Admitting: Family

## 2022-03-21 DIAGNOSIS — K219 Gastro-esophageal reflux disease without esophagitis: Secondary | ICD-10-CM

## 2022-04-01 ENCOUNTER — Other Ambulatory Visit: Payer: Self-pay | Admitting: Family

## 2022-04-01 ENCOUNTER — Other Ambulatory Visit: Payer: Self-pay | Admitting: Cardiology

## 2022-04-01 DIAGNOSIS — R0602 Shortness of breath: Secondary | ICD-10-CM

## 2022-04-14 ENCOUNTER — Other Ambulatory Visit: Payer: Self-pay | Admitting: Cardiovascular Disease

## 2022-05-01 ENCOUNTER — Other Ambulatory Visit: Payer: Self-pay | Admitting: Cardiovascular Disease

## 2022-05-05 ENCOUNTER — Other Ambulatory Visit: Payer: Self-pay

## 2022-05-05 MED ORDER — HYDROCODONE-ACETAMINOPHEN 5-325 MG PO TABS: 1.0000 | ORAL_TABLET | Freq: Two times a day (BID) | ORAL | 0 refills | Status: DC | PRN

## 2022-05-06 ENCOUNTER — Other Ambulatory Visit: Payer: Self-pay | Admitting: Cardiovascular Disease

## 2022-05-06 DIAGNOSIS — R0602 Shortness of breath: Secondary | ICD-10-CM

## 2022-05-06 DIAGNOSIS — E782 Mixed hyperlipidemia: Secondary | ICD-10-CM

## 2022-05-17 ENCOUNTER — Ambulatory Visit: Payer: 59 | Admitting: Family

## 2022-07-05 ENCOUNTER — Other Ambulatory Visit: Payer: Self-pay

## 2022-07-05 NOTE — Telephone Encounter (Signed)
Pt called requesting refill

## 2022-07-08 MED ORDER — HYDROCODONE-ACETAMINOPHEN 5-325 MG PO TABS: 1.0000 | ORAL_TABLET | Freq: Two times a day (BID) | ORAL | 0 refills | Status: DC | PRN

## 2022-07-11 ENCOUNTER — Other Ambulatory Visit: Payer: Self-pay | Admitting: Cardiovascular Disease

## 2022-07-19 ENCOUNTER — Ambulatory Visit (INDEPENDENT_AMBULATORY_CARE_PROVIDER_SITE_OTHER): Payer: 59 | Admitting: Cardiovascular Disease

## 2022-07-19 ENCOUNTER — Encounter: Payer: Self-pay | Admitting: Cardiovascular Disease

## 2022-07-19 VITALS — BP 170/86 | HR 98 | Ht 65.0 in | Wt 165.8 lb

## 2022-07-19 DIAGNOSIS — I10 Essential (primary) hypertension: Secondary | ICD-10-CM | POA: Diagnosis not present

## 2022-07-19 DIAGNOSIS — K529 Noninfective gastroenteritis and colitis, unspecified: Secondary | ICD-10-CM

## 2022-07-19 DIAGNOSIS — Z720 Tobacco use: Secondary | ICD-10-CM

## 2022-07-19 DIAGNOSIS — F17210 Nicotine dependence, cigarettes, uncomplicated: Secondary | ICD-10-CM

## 2022-07-19 DIAGNOSIS — I2510 Atherosclerotic heart disease of native coronary artery without angina pectoris: Secondary | ICD-10-CM

## 2022-07-19 MED ORDER — LOSARTAN POTASSIUM-HCTZ 50-12.5 MG PO TABS: 1.0000 | ORAL_TABLET | Freq: Every day | ORAL | 11 refills | Status: DC

## 2022-07-19 MED ORDER — PANTOPRAZOLE SODIUM 40 MG PO TBEC: 40.0000 mg | DELAYED_RELEASE_TABLET | Freq: Every day | ORAL | 1 refills | Status: DC

## 2022-07-19 NOTE — Progress Notes (Signed)
Cardiology Office Note   Date:  07/19/2022   ID:  Sabrina Barker, DOB 08-07-1950, MRN [id number]  PCP:  Miki Kins, FNP  Cardiologist:  Adrian Blackwater, MD      History of Present Illness: Sabrina Barker is a 72 y.o. female who presents for  Chief Complaint  Patient presents with   Follow-up    Follow up, med check.    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   Depression    GERD (gastroesophageal reflux disease)    Heart murmur    Hyperlipidemia    Hypertension      Past Surgical History:  Procedure Laterality Date   ABDOMINAL HYSTERECTOMY     APPENDECTOMY     CORONARY STENT INTERVENTION N/A 03/20/2020   Procedure: CORONARY STENT INTERVENTION;  Surgeon: Yvonne Kendall, MD;  Location: [facility] INVASIVE CV LAB;  Service: Cardiovascular;  Laterality: N/A;   FACIAL COSMETIC SURGERY  2016   LAPAROSCOPY ABDOMEN DIAGNOSTIC  2016   removal of adhesions   LEFT HEART CATH AND CORONARY ANGIOGRAPHY N/A 03/20/2020   Procedure: LEFT HEART CATH AND CORONARY ANGIOGRAPHY with Intervention;  Surgeon: Laurier Nancy, MD;  Location: [facility] INVASIVE CV LAB;  Service: Cardiovascular;  Laterality: N/A;   rhinoplasty  2016   tummy tuck  2016   VASCULAR SURGERY       Current Outpatient Medications  Medication Sig Dispense Refill   atorvastatin (LIPITOR) 80 MG tablet TAKE 1 TABLET BY MOUTH EVERY DAY 90 tablet 1   BRILINTA 90 MG TABS tablet TAKE 1 TABLET BY MOUTH TWICE A DAY 60 tablet 1   buPROPion (WELLBUTRIN SR) 150 MG 12 hr tablet Take 150 mg by mouth daily.     busPIRone (BUSPAR) 10 MG tablet Take 10 mg by mouth 2 (two) times daily as needed.     gabapentin (NEURONTIN) 400 MG capsule Take 400 mg by mouth at bedtime.     HYDROcodone-acetaminophen (NORCO/VICODIN)  5-325 MG tablet Take 1 tablet by mouth 2 (two) times daily as needed for severe pain. 30 tablet 0   hydrOXYzine (ATARAX) 25 MG tablet Take 25 mg by mouth 3 (three) times daily.     losartan-hydrochlorothiazide (HYZAAR) 50-12.5 MG tablet Take 1 tablet by mouth daily. 30 tablet 11   pantoprazole (PROTONIX) 40 MG tablet Take 1 tablet (40 mg total) by mouth daily. 30 tablet 1   tiZANidine (ZANAFLEX) 4 MG tablet TAKE 1 TABLET BY MOUTH FOUR TIMES A DAY 360 tablet 3   traZODone (DESYREL) 100 MG tablet Take 100 mg by mouth at bedtime.     VENTOLIN HFA 108 (90 Base) MCG/ACT inhaler Take 2 puffs by mouth every 4 (four) hours as needed for wheezing or shortness of breath.     metoprolol succinate (TOPROL-XL) 25 MG 24 hr tablet TAKE 1 TABLET BY MOUTH EVERY DAY (Patient not taking: Reported on 07/19/2022) 90 tablet 0   No current facility-administered medications for this visit.    Allergies:   Iodinated contrast media, Penicillins, Penicillins, and Shellfish allergy    Social History:   reports that she has been smoking cigarettes. She has been smoking an average of .15 packs per day. She has never used smokeless tobacco.  She reports that she does not drink alcohol and does not use drugs.   Family History:  family history includes Cancer in her mother; Heart disease in her brother, brother, father, and mother; Stroke in her mother.    ROS:     Review of Systems  Constitutional: Negative.   HENT: Negative.    Eyes: Negative.   Respiratory: Negative.    Gastrointestinal: Negative.   Genitourinary: Negative.   Musculoskeletal: Negative.   Skin: Negative.   Neurological: Negative.   Endo/Heme/Allergies: Negative.   Psychiatric/Behavioral: Negative.    All other systems reviewed and are negative.     All other systems are reviewed and negative.    PHYSICAL EXAM: VS:  BP (!) 170/86   Pulse 98   Ht 5\' 5"  (1.651 m)   Wt 165 lb 12.8 oz (75.2 kg)   SpO2 94%   BMI 27.59 kg/m  , BMI Body mass  index is 27.59 kg/m. Last weight:  Wt Readings from Last 3 Encounters:  07/19/22 165 lb 12.8 oz (75.2 kg)  01/26/22 170 lb (77.1 kg)  03/21/20 168 lb 9.6 oz (76.5 kg)     Physical Exam Constitutional:      Appearance: Normal appearance.  Cardiovascular:     Rate and Rhythm: Normal rate and regular rhythm.     Heart sounds: Normal heart sounds.  Pulmonary:     Effort: Pulmonary effort is normal.     Breath sounds: Normal breath sounds.  Musculoskeletal:     Right lower leg: No edema.     Left lower leg: No edema.  Neurological:     Mental Status: She is alert.       EKG:   Recent Labs: 01/28/2022: Hemoglobin 12.3; Platelets 207 01/30/2022: ALT 42; BUN 11; Creatinine, Ser 0.79; Magnesium 2.0; Potassium 3.9; Sodium 140    Lipid Panel    Component Value Date/Time   CHOL 139 03/20/2020 0227   TRIG 111 03/20/2020 0227   HDL 37 (L) 03/20/2020 0227   CHOLHDL 3.8 03/20/2020 0227   VLDL 22 03/20/2020 0227   LDLCALC 80 03/20/2020 0227      Other studies Reviewed: Additional studies/ records that were reviewed today include:  Review of the above records demonstrates:   REASON FOR VISIT  Referred by Dr.Shakela Donati Welton Flakes.        TESTS  Imaging: Computed Tomographic Angiography:  Cardiac multidetector CT was performed paying particular attention to the coronary arteries for the diagnosis of: CAD. Diagnostic Drugs:  Administered iohexol (Omnipaque) through an antecubital vein and images from the examination were analyzed for the presence and extent of coronary artery disease, using 3D image processing software. 100 mL of non-ionic contrast (Omnipaque) was used.     TEST CONCLUSIONS  Quality of study: Good  1-Calcium score: 2079.3  2-Right dominant system  3-Severely calcified distal RCA with no significant restenosis in stent. Minor luminor irregulatities in LAD and LCX.      Adrian Blackwater MD  Electronically signed by: Adrian Blackwater     Date: 10/05/2021  09:04 REASON FOR VISIT  Visit for: Echocardiogram/R07.9  Sex:   Female  wt= 175   lbs.  BP=186/92  Height=65    inches.        TESTS  Imaging: Echocardiogram:  An echocardiogram in (2-d) mode was performed and in Doppler mode with color flow velocity mapping was performed. The aortic valve cusps are abnormal 1.9  cm, flow velocity 1.35   m/s, and systolic calculated mean flow  gradient 4  mmHg. Mitral valve diastolic peak flow velocity E 1.61     m/s and E/A ratio 3..1. Aortic root diameter 4.0  cm. The LVOT internal diameter 4.1  cm and flow velocity was abnormal 1.13  m/s. LV systolic dimension 1.83  cm, diastolic 4 cm, posterior wall thickness 1.47    cm, fractional shortening 54.3  %, and EF 85.6 %. IVS thickness 1.11    cm. LA dimension 4.4 cm. Mitral Valve has Trace Regurgitation. Pulmonic Valve has Trace Regurgitation. Tricuspid Valve has Trace Regurgitation.     ASSESSMENT  Technically adequate study.  Normal chamber sizes.  Normal left ventricular systolic function.  Mild left ventricular hypertrophy with GRADE 3 (restrictive physiology) diastolic dysfunction.  Normal right ventricular systolic function.  Normal right ventricular diastolic function.  Normal left ventricular wall motion.  Normal right ventricular wall motion.  Trace pulmonary regurgitation.  Trace tricuspid regurgitation.  Normal pulmonary artery pressure.  Trace mitral regurgitation.  No pericardial effusion.  No LVH.     THERAPY   Referring physician: Laurier Nancy  Sonographer: Adrian Blackwater.      Adrian Blackwater MD  Electronically signed by: Adrian Blackwater     Date: 06/11/2021 08:53     No data to display            ASSESSMENT AND PLAN:    ICD-10-CM   1. Essential hypertension  I10 MYOCARDIAL PERFUSION IMAGING    PCV ECHOCARDIOGRAM COMPLETE    pantoprazole (PROTONIX) 40 MG tablet    Basic metabolic panel   Had been on diuretic/losatan 100, but due to hypotension seconady to  gastroentritsrduced dosage to 25 mg losartan. Will check labs and increase to 50 losartan .    2. Acute gastroenteritis  K52.9 MYOCARDIAL PERFUSION IMAGING    PCV ECHOCARDIOGRAM COMPLETE    pantoprazole (PROTONIX) 40 MG tablet    Basic metabolic panel    3. Tobacco use  Z72.0 MYOCARDIAL PERFUSION IMAGING    PCV ECHOCARDIOGRAM COMPLETE    pantoprazole (PROTONIX) 40 MG tablet    Basic metabolic panel    4. Coronary artery disease involving native coronary artery of native heart without angina pectoris  I25.10 MYOCARDIAL PERFUSION IMAGING    PCV ECHOCARDIOGRAM COMPLETE    pantoprazole (PROTONIX) 40 MG tablet    Basic metabolic panel   has no chest pain but will do stress test as troponins were elevated during hospitalization. Had PCI of PLV branch of RCA in past.       Problem List Items Addressed This Visit       Cardiovascular and Mediastinum   Essential hypertension - Primary   Relevant Medications   losartan-hydrochlorothiazide (HYZAAR) 50-12.5 MG tablet   pantoprazole (PROTONIX) 40 MG tablet   Other Relevant Orders   MYOCARDIAL PERFUSION IMAGING   PCV ECHOCARDIOGRAM COMPLETE   Basic metabolic panel     Digestive   Acute gastroenteritis   Relevant Medications   pantoprazole (PROTONIX) 40 MG tablet   Other Relevant Orders   MYOCARDIAL PERFUSION IMAGING   PCV ECHOCARDIOGRAM COMPLETE   Basic metabolic panel     Other   Tobacco use   Relevant Medications   pantoprazole (PROTONIX) 40 MG tablet   Other Relevant Orders   MYOCARDIAL PERFUSION IMAGING   PCV ECHOCARDIOGRAM COMPLETE   Basic metabolic panel   Other Visit Diagnoses     Coronary artery disease involving native coronary artery of native heart without angina pectoris       has no  chest pain but will do stress test as troponins were elevated during hospitalization. Had PCI of PLV branch of RCA in past.   Relevant Medications   losartan-hydrochlorothiazide (HYZAAR) 50-12.5 MG tablet   pantoprazole (PROTONIX)  40 MG tablet   Other Relevant Orders   MYOCARDIAL PERFUSION IMAGING   PCV ECHOCARDIOGRAM COMPLETE   Basic metabolic panel          Disposition:   Return in about 2 weeks (around 08/02/2022) for echo/stress test and f/u.    Total time spent: 40 minutes  Signed,  Adrian Blackwater, MD  07/19/2022 11:24 AM    [facility]

## 2022-07-20 ENCOUNTER — Encounter: Payer: Self-pay | Admitting: Family

## 2022-07-20 ENCOUNTER — Ambulatory Visit (INDEPENDENT_AMBULATORY_CARE_PROVIDER_SITE_OTHER): Payer: 59 | Admitting: Family

## 2022-07-20 VITALS — BP 136/74 | HR 92 | Ht 65.0 in | Wt 165.6 lb

## 2022-07-20 DIAGNOSIS — I10 Essential (primary) hypertension: Secondary | ICD-10-CM | POA: Diagnosis not present

## 2022-07-20 DIAGNOSIS — I2510 Atherosclerotic heart disease of native coronary artery without angina pectoris: Secondary | ICD-10-CM | POA: Insufficient documentation

## 2022-07-20 DIAGNOSIS — Z720 Tobacco use: Secondary | ICD-10-CM | POA: Diagnosis not present

## 2022-07-20 DIAGNOSIS — K219 Gastro-esophageal reflux disease without esophagitis: Secondary | ICD-10-CM

## 2022-07-20 DIAGNOSIS — J449 Chronic obstructive pulmonary disease, unspecified: Secondary | ICD-10-CM | POA: Diagnosis not present

## 2022-07-20 DIAGNOSIS — E538 Deficiency of other specified B group vitamins: Secondary | ICD-10-CM

## 2022-07-20 DIAGNOSIS — E782 Mixed hyperlipidemia: Secondary | ICD-10-CM | POA: Diagnosis not present

## 2022-07-20 DIAGNOSIS — R7303 Prediabetes: Secondary | ICD-10-CM

## 2022-07-20 DIAGNOSIS — R5383 Other fatigue: Secondary | ICD-10-CM

## 2022-07-20 DIAGNOSIS — E559 Vitamin D deficiency, unspecified: Secondary | ICD-10-CM

## 2022-07-20 LAB — BASIC METABOLIC PANEL
BUN/Creatinine Ratio: 9 — ABNORMAL LOW (ref 12–28)
BUN: 9 mg/dL (ref 8–27)
CO2: 23 mmol/L (ref 20–29)
Calcium: 9.9 mg/dL (ref 8.7–10.3)
Chloride: 103 mmol/L (ref 96–106)
Creatinine, Ser: 0.96 mg/dL (ref 0.57–1.00)
Glucose: 96 mg/dL (ref 70–99)
Potassium: 3.6 mmol/L (ref 3.5–5.2)
Sodium: 143 mmol/L (ref 134–144)
eGFR: 64 mL/min/{1.73_m2} (ref >59)

## 2022-07-20 MED ORDER — GABAPENTIN 400 MG PO CAPS: 400.0000 mg | ORAL_CAPSULE | Freq: Every day | ORAL | 1 refills | Status: DC

## 2022-07-20 MED ORDER — PANTOPRAZOLE SODIUM 40 MG PO TBEC: 40.0000 mg | DELAYED_RELEASE_TABLET | Freq: Every day | ORAL | 3 refills | Status: DC

## 2022-07-20 NOTE — Progress Notes (Signed)
Established Patient Office Visit  Subjective:  Patient ID: Sabrina Barker, female    DOB: [birth year]  Age: 72 y.o. MRN: [id number]  Chief Complaint  Patient presents with   Follow-up    5 month follow up    Patient is here today for her 4 months follow up.  She has been feeling well since last appointment.   She does not have additional concerns to discuss today.  Labs are due today. She needs refills.   I have reviewed her active problem list, medication list, allergies, notes from last encounter, lab results for her appointment today.    No other concerns at this time.   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   Depression    GERD (gastroesophageal reflux disease)    Heart murmur    Hyperlipidemia    Hypertension    Hypokalemia 01/30/2022   Hypomagnesemia 01/30/2022    Past Surgical History:  Procedure Laterality Date   ABDOMINAL HYSTERECTOMY     APPENDECTOMY     CORONARY STENT INTERVENTION N/A 03/20/2020   Procedure: CORONARY STENT INTERVENTION;  Surgeon: Yvonne Kendall, MD;  Location: [facility] INVASIVE CV [facility];  Service: Cardiovascular;  Laterality: N/A;   FACIAL COSMETIC SURGERY  2016   LAPAROSCOPY ABDOMEN DIAGNOSTIC  2016   removal of adhesions   LEFT HEART CATH AND CORONARY ANGIOGRAPHY N/A 03/20/2020   Procedure: LEFT HEART CATH AND CORONARY ANGIOGRAPHY with Intervention;  Surgeon: Laurier Nancy, MD;  Location: [facility] INVAS[facility] CV [facility];  Service: Cardiovascular;  Laterality: N/A;   rhinoplasty  2016   tummy tuck  2016   VASCULAR SURGERY      Social History   Socioeconomic History   Marital status: Widowed    Spouse name: Not on file   Number of children: Not on file   Years of education: Not on file   Highest education level: Not on file  Occupational History   Not on file  Tobacco Use   Smoking status: Every Day     Packs/day: .15    Types: Cigarettes   Smokeless tobacco: Never   Tobacco comments:    3-4 cig/day  Substance and Sexual Activity   Alcohol use: No   Drug use: Never   Sexual activity: Not on file  Other Topics Concern   Not on file  Social History Narrative   ** Merged History Encounter **       Social Determinants of Health   Financial Resource Strain: Not on file  Food Insecurity: No Food Insecurity (01/27/2022)   Hunger Vital Sign    Worried About Running Out of Food in the Last Year: Never true    Ran Out of Food in the Last Year: Never true  Transportation Needs: No Transportation Needs (01/27/2022)   PRAPARE - Administrator, Civil Service (Medical): No    Lack of Transportation (Non-Medical): No  Physical Activity: Not on file  Stress: Not on file  Social Connections: Not on file  Intimate Partner Violence: Not At Risk (01/27/2022)   Humiliation, Afraid, Rape, and Kick questionnaire    Fear of Current or Ex-Partner: No    Emotionally Abused: No    Physically Abused: No    Sexually Abused: No    Family History  Problem Relation Age of Onset   Heart disease Mother  Stroke Mother    Cancer Mother    Heart disease Father    Heart disease Brother    Heart disease Brother     Allergies  Allergen Reactions   Iodinated Contrast Media Anaphylaxis    Swelling and can't breath    Penicillins Shortness Of Breath and Swelling    Patient states "I pass out"  EMS is normally called.    Penicillins Anaphylaxis   Shellfish Allergy Anaphylaxis    Iodine fine topically.  Can not use ivp dye    ROS     Objective:   BP 136/74   Pulse 92   Ht 5\' 5"  (1.651 m)   Wt 165 lb 9.6 oz (75.1 kg)   SpO2 94%   BMI 27.56 kg/m   Vitals:   07/20/22 0935  BP: 136/74  Pulse: 92  Height: 5\' 5"  (1.651 m)  Weight: 165 lb 9.6 oz (75.1 kg)  SpO2: 94%  BMI (Calculated): 27.56    Physical Exam   No results found for any visits on 07/20/22.  Recent  Results (from the past 2160 hour(s))  Basic metabolic panel     Status: Abnormal   Collection Time: 07/19/22 11:34 AM  Result Value Ref Range   Glucose 96 70 - 99 mg/dL   BUN 9 8 - 27 mg/dL   Creatinine, Ser 1.61 0.57 - 1.00 mg/dL   eGFR 64 >09 UE/AVW/0.98   BUN/Creatinine Ratio 9 (L) 12 - 28   Sodium 143 134 - 144 mmol/L   Potassium 3.6 3.5 - 5.2 mmol/L   Chloride 103 96 - 106 mmol/L   CO2 23 20 - 29 mmol/L   Calcium 9.9 8.7 - 10.3 mg/dL       Assessment & Plan:   Problem List Items Addressed This Visit       Active Problems   Essential hypertension, benign   Relevant Medications   aspirin EC 81 MG tablet   Other Relevant Orders   CBC With Differential   CMP14+EGFR   COPD (chronic obstructive pulmonary disease) (HCC)   Relevant Orders   CBC With Differential   CMP14+EGFR   Tobacco use - Primary   Relevant Orders   CBC With Differential   CMP14+EGFR   Other Visit Diagnoses     Prediabetes       Relevant Orders   CBC With Differential   CMP14+EGFR   Hemoglobin A1c   B12 deficiency due to diet       Relevant Orders   CBC With Differential   CMP14+EGFR   Vitamin B12   Mixed hyperlipidemia       Relevant Medications   aspirin EC 81 MG tablet   Other Relevant Orders   Lipid panel   CBC With Differential   CMP14+EGFR   Vitamin D deficiency, unspecified       Relevant Orders   VITAMIN D 25 Hydroxy (Vit-D Deficiency, Fractures)   CBC With Differential   CMP14+EGFR   Other fatigue       Relevant Orders   CBC With Differential   CMP14+EGFR   TSH   Gastroesophageal reflux disease without esophagitis       Relevant Medications   pantoprazole (PROTONIX) 40 MG tablet       Return in about 4 months (around 11/19/2022) for F/U.   Total time spent: 30 minutes  Miki Kins, FNP  07/20/2022   This document may have been prepared by [facility] Voice Recognition software and as such may include  unintentional dictation errors.

## 2022-07-21 LAB — CBC WITH DIFFERENTIAL
Basophils Absolute: 0.1 10*3/uL (ref 0.0–0.2)
Basos: 1 %
EOS (ABSOLUTE): 0.1 10*3/uL (ref 0.0–0.4)
Eos: 1 %
Hematocrit: 40.2 % (ref 34.0–46.6)
Hemoglobin: 13 g/dL (ref 11.1–15.9)
Immature Grans (Abs): 0 10*3/uL (ref 0.0–0.1)
Immature Granulocytes: 0 %
Lymphocytes Absolute: 1.8 10*3/uL (ref 0.7–3.1)
Lymphs: 23 %
MCH: 29.5 pg (ref 26.6–33.0)
MCHC: 32.3 g/dL (ref 31.5–35.7)
MCV: 91 fL (ref 79–97)
Monocytes Absolute: 0.5 10*3/uL (ref 0.1–0.9)
Monocytes: 6 %
Neutrophils Absolute: 5.5 10*3/uL (ref 1.4–7.0)
Neutrophils: 69 %
RBC: 4.41 x10E6/uL (ref 3.77–5.28)
RDW: 13.8 % (ref 11.7–15.4)
WBC: 8 10*3/uL (ref 3.4–10.8)

## 2022-07-21 LAB — CMP14+EGFR
ALT: 25 IU/L (ref 0–32)
AST: 21 IU/L (ref 0–40)
Albumin/Globulin Ratio: 1.8
Albumin: 4.3 g/dL (ref 3.9–4.9)
Alkaline Phosphatase: 85 IU/L (ref 44–121)
BUN/Creatinine Ratio: 11 — ABNORMAL LOW (ref 12–28)
BUN: 10 mg/dL (ref 8–27)
Bilirubin Total: 0.4 mg/dL (ref 0.0–1.2)
CO2: 24 mmol/L (ref 20–29)
Calcium: 9.8 mg/dL (ref 8.7–10.3)
Chloride: 103 mmol/L (ref 96–106)
Creatinine, Ser: 0.93 mg/dL (ref 0.57–1.00)
Globulin, Total: 2.4 g/dL (ref 1.5–4.5)
Glucose: 125 mg/dL — ABNORMAL HIGH (ref 70–99)
Potassium: 3.7 mmol/L (ref 3.5–5.2)
Sodium: 143 mmol/L (ref 134–144)
Total Protein: 6.7 g/dL (ref 6.0–8.5)
eGFR: 66 mL/min/{1.73_m2} (ref >59)

## 2022-07-21 LAB — LIPID PANEL
Chol/HDL Ratio: 2.5 ratio (ref 0.0–4.4)
Cholesterol, Total: 121 mg/dL (ref 100–199)
HDL: 49 mg/dL (ref >39)
LDL Chol Calc (NIH): 52 mg/dL (ref 0–99)
Triglycerides: 106 mg/dL (ref 0–149)
VLDL Cholesterol Cal: 20 mg/dL (ref 5–40)

## 2022-07-21 LAB — TSH: TSH: 0.39 u[IU]/mL — ABNORMAL LOW (ref 0.450–4.500)

## 2022-07-21 LAB — HEMOGLOBIN A1C
Est. average glucose Bld gHb Est-mCnc: 117 mg/dL
Hgb A1c MFr Bld: 5.7 % — ABNORMAL HIGH (ref 4.8–5.6)

## 2022-07-21 LAB — VITAMIN B12: Vitamin B-12: 572 pg/mL (ref 232–1245)

## 2022-07-21 LAB — VITAMIN D 25 HYDROXY (VIT D DEFICIENCY, FRACTURES): Vit D, 25-Hydroxy: 37.1 ng/mL (ref 30.0–100.0)

## 2022-07-27 ENCOUNTER — Ambulatory Visit (INDEPENDENT_AMBULATORY_CARE_PROVIDER_SITE_OTHER): Payer: 59

## 2022-07-27 DIAGNOSIS — I340 Nonrheumatic mitral (valve) insufficiency: Secondary | ICD-10-CM | POA: Diagnosis not present

## 2022-07-27 DIAGNOSIS — I10 Essential (primary) hypertension: Secondary | ICD-10-CM

## 2022-07-27 DIAGNOSIS — I371 Nonrheumatic pulmonary valve insufficiency: Secondary | ICD-10-CM | POA: Diagnosis not present

## 2022-07-27 DIAGNOSIS — I2510 Atherosclerotic heart disease of native coronary artery without angina pectoris: Secondary | ICD-10-CM

## 2022-07-27 DIAGNOSIS — Z720 Tobacco use: Secondary | ICD-10-CM

## 2022-07-27 DIAGNOSIS — I361 Nonrheumatic tricuspid (valve) insufficiency: Secondary | ICD-10-CM | POA: Diagnosis not present

## 2022-07-27 DIAGNOSIS — K529 Noninfective gastroenteritis and colitis, unspecified: Secondary | ICD-10-CM

## 2022-07-28 ENCOUNTER — Ambulatory Visit (INDEPENDENT_AMBULATORY_CARE_PROVIDER_SITE_OTHER): Payer: 59

## 2022-07-28 DIAGNOSIS — I10 Essential (primary) hypertension: Secondary | ICD-10-CM

## 2022-07-28 DIAGNOSIS — I2510 Atherosclerotic heart disease of native coronary artery without angina pectoris: Secondary | ICD-10-CM | POA: Diagnosis not present

## 2022-07-28 DIAGNOSIS — K529 Noninfective gastroenteritis and colitis, unspecified: Secondary | ICD-10-CM

## 2022-07-28 DIAGNOSIS — Z720 Tobacco use: Secondary | ICD-10-CM

## 2022-07-28 MED ORDER — TECHNETIUM TC 99M SESTAMIBI GENERIC - CARDIOLITE
10.5000 | Freq: Once | INTRAVENOUS | Status: AC | PRN
Start: 2022-07-28 — End: 2022-07-28
  Administered 2022-07-28: 10.5 via INTRAVENOUS

## 2022-07-28 MED ORDER — TECHNETIUM TC 99M SESTAMIBI GENERIC - CARDIOLITE
30.6000 | Freq: Once | INTRAVENOUS | Status: AC | PRN
  Administered 2022-07-28: 30.6 via INTRAVENOUS

## 2022-07-29 ENCOUNTER — Other Ambulatory Visit: Payer: 59

## 2022-08-04 ENCOUNTER — Ambulatory Visit (INDEPENDENT_AMBULATORY_CARE_PROVIDER_SITE_OTHER): Payer: 59 | Admitting: Cardiovascular Disease

## 2022-08-04 ENCOUNTER — Encounter: Payer: Self-pay | Admitting: Cardiovascular Disease

## 2022-08-04 VITALS — BP 150/80 | HR 82 | Ht 65.0 in | Wt 163.8 lb

## 2022-08-04 DIAGNOSIS — I2510 Atherosclerotic heart disease of native coronary artery without angina pectoris: Secondary | ICD-10-CM | POA: Diagnosis not present

## 2022-08-04 DIAGNOSIS — Z720 Tobacco use: Secondary | ICD-10-CM | POA: Diagnosis not present

## 2022-08-04 DIAGNOSIS — I739 Peripheral vascular disease, unspecified: Secondary | ICD-10-CM

## 2022-08-04 DIAGNOSIS — R0602 Shortness of breath: Secondary | ICD-10-CM

## 2022-08-04 DIAGNOSIS — I10 Essential (primary) hypertension: Secondary | ICD-10-CM | POA: Diagnosis not present

## 2022-08-04 DIAGNOSIS — F17210 Nicotine dependence, cigarettes, uncomplicated: Secondary | ICD-10-CM | POA: Diagnosis not present

## 2022-08-04 MED ORDER — NICOTINE 21 MG/24HR TD PT24
21.0000 mg | MEDICATED_PATCH | TRANSDERMAL | 1 refills | Status: DC
Start: 2022-08-04 — End: 2022-09-01

## 2022-08-04 NOTE — Progress Notes (Signed)
Cardiology Office Note   Date:  08/04/2022   ID:  Sabrina Barker, DOB 05-May-1950, MRN [id number]  PCP:  Miki Kins, FNP  Cardiologist:  Adrian Blackwater, MD      History of Present Illness: Sabrina Barker is a 72 y.o. 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   Depression    GERD (gastroesophageal reflux disease)    Heart murmur    Hyperlipidemia    Hypertension    Hypokalemia 01/30/2022   Hypomagnesemia 01/30/2022     Past Surgical History:  Procedure Laterality Date   ABDOMINAL HYSTERECTOMY     APPENDECTOMY     CORONARY STENT INTERVENTION N/A 03/20/2020   Procedure: CORONARY STENT INTERVENTION;  Surgeon: Yvonne Kendall, MD;  Location: [facility] INV[facility];  Service: Cardiovascular;  Laterality: N/A;   FACIAL COSMETIC SURGERY  2016   LAPAROSCOPY ABDOMEN DIAGNOSTIC  2016   removal of adhesions   LEFT HEART CATH AND CORONARY ANGIOGRAPHY N/A 03/20/2020   Procedure: LEFT HEART CATH AND CORONARY ANGIOGRAPHY with Intervention;  Surgeon: Laurier Nancy, MD;  Location: [facility] IN[facility];  Service: Cardiovascular;  Laterality: N/A;   rhinoplasty  2016   tummy tuck  2016   VASCULAR SURGERY       Current Outpatient Medications  Medication Sig Dispense Refill   nicotine (NICODERM CQ - DOSED IN MG/24 HOURS) 21 mg/24hr patch Place 1 patch (21 mg total) onto the skin daily. 30 patch 1   aspirin EC 81 MG tablet Take 81 mg by mouth daily. Swallow whole.     Atogepant (QULIPTA) 60 MG TABS Take by mouth.     atorvastatin (LIPITOR) 80 MG tablet TAKE 1 TABLET BY MOUTH EVERY DAY 90 tablet 1   Biotin 5000 MCG CAPS Take by mouth.     BRILINTA 90 MG TABS  tablet TAKE 1 TABLET BY MOUTH TWICE A DAY 60 tablet 1   buPROPion (WELLBUTRIN SR) 150 MG 12 hr tablet Take 150 mg by mouth daily.     Folic Acid (FOLATE PO) Take by mouth.     gabapentin (NEURONTIN) 400 MG capsule Take 1 capsule (400 mg total) by mouth at bedtime. 90 capsule 1   HYDROcodone-acetaminophen (NORCO/VICODIN) 5-325 MG tablet Take 1 tablet by mouth 2 (two) times daily as needed for severe pain. 30 tablet 0   hydrOXYzine (ATARAX) 10 MG tablet Take 10 mg by mouth 3 (three) times daily as needed.     losartan-hydrochlorothiazide (HYZAAR) 50-12.5 MG tablet Take 1 tablet by mouth daily. 30 tablet 11   Melatonin 10 MG TABS Take by mouth.     Omega-3 Fatty Acids (FISH OIL) 300 MG CAPS Take by mouth.     pantoprazole (PROTONIX) 40 MG tablet Take 1 tablet (40 mg total) by mouth daily. 90 tablet 3   tiZANidine (ZANAFLEX) 4 MG tablet TAKE 1 TABLET BY MOUTH FOUR TIMES A DAY 360 tablet 3   traZODone (DESYREL) 100 MG tablet Take 100 mg by mouth at bedtime.     VENTOLIN HFA 108 (90 Base) MCG/ACT inhaler Take 2 puffs by  mouth every 4 (four) hours as needed for wheezing or shortness of breath.     Zinc 50 MG TABS Take by mouth.     No current facility-administered medications for this visit.    Allergies:   Iodinated contrast media, Penicillins, Penicillins, and Shellfish allergy    Social History:   reports that she has been smoking cigarettes. She has been smoking an average of .15 packs per day. She has never used smokeless tobacco. She reports that she does not drink alcohol and does not use drugs.   Family History:  family history includes Cancer in her mother; Heart disease in her brother, brother, father, and mother; Stroke in her mother.    ROS:     Review of Systems  Constitutional: Negative.   HENT: Negative.    Eyes: Negative.   Respiratory: Negative.    Gastrointestinal: Negative.   Genitourinary: Negative.   Musculoskeletal: Negative.   Skin: Negative.   Neurological:  Negative.   Endo/Heme/Allergies: Negative.   Psychiatric/Behavioral: Negative.    All other systems reviewed and are negative.     All other systems are reviewed and negative.    PHYSICAL EXAM: VS:  BP (!) 150/80   Pulse 82   Ht 5\' 5"  (1.651 m)   Wt 163 lb 12.8 oz (74.3 kg)   SpO2 97%   BMI 27.26 kg/m  , BMI Body mass index is 27.26 kg/m. Last weight:  Wt Readings from Last 3 Encounters:  08/04/22 163 lb 12.8 oz (74.3 kg)  07/20/22 165 lb 9.6 oz (75.1 kg)  07/19/22 165 lb 12.8 oz (75.2 kg)     Physical Exam Constitutional:      Appearance: Normal appearance.  Cardiovascular:     Rate and Rhythm: Normal rate and regular rhythm.     Heart sounds: Normal heart sounds.  Pulmonary:     Effort: Pulmonary effort is normal.     Breath sounds: Normal breath sounds.  Musculoskeletal:     Right lower leg: No edema.     Left lower leg: No edema.  Neurological:     Mental Status: She is alert.       EKG:   Recent Labs: 01/28/2022: Platelets 207 01/30/2022: Magnesium 2.0 07/20/2022: ALT 25; BUN 10; Creatinine, Ser 0.93; Hemoglobin 13.0; Potassium 3.7; Sodium 143; TSH 0.390    Lipid Panel    Component Value Date/Time   CHOL 121 07/20/2022 1038   TRIG 106 07/20/2022 1038   HDL 49 07/20/2022 1038   CHOLHDL 2.5 07/20/2022 1038   CHOLHDL 3.8 03/20/2020 0227   VLDL 22 03/20/2020 0227   LDLCALC 52 07/20/2022 1038      Other studies Reviewed: Additional studies/ records that were reviewed today include:  Review of the above records demonstrates:       No data to display            ASSESSMENT AND PLAN:    ICD-10-CM   1. Coronary artery disease involving native coronary artery of native heart without angina pectoris  I25.10 US ARTERIAL ABI (SCREENING LOWER EXTREMITY)    nicotine (NICODERM CQ - DOSED IN MG/24 HOURS) 21 mg/24hr patch   stress test apical defect, which is low risk. ECHO had trace MR/AR/TR, nornmal LVEF. Reassured patient.    2. Essential  hypertension, benign  I10 US ARTERIAL ABI (SCREENING LOWER EXTREMITY)    nicotine (NICODERM CQ - DOSED IN MG/24 HOURS) 21 mg/24hr patch    3. Tobacco use  Z72.0 US ARTERIAL ABI (  SCREENING LOWER EXTREMITY)    nicotine (NICODERM CQ - DOSED IN MG/24 HOURS) 21 mg/24hr patch   will put on nicotine patch    4. SOB (shortness of breath)  R06.02 US ARTERIAL ABI (SCREENING LOWER EXTREMITY)    nicotine (NICODERM CQ - DOSED IN MG/24 HOURS) 21 mg/24hr patch    5. Claudication of both lower extremities (HCC)  I73.9 US ARTERIAL ABI (SCREENING LOWER EXTREMITY)    nicotine (NICODERM CQ - DOSED IN MG/24 HOURS) 21 mg/24hr patch   Has pain in lgs and is smoker, wil do ABI       Problem List Items Addressed This Visit       Cardiovascular and Mediastinum   Essential hypertension, benign   Relevant Medications   nicotine (NICODERM CQ - DOSED IN MG/24 HOURS) 21 mg/24hr patch   Other Relevant Orders   US ARTERIAL ABI (SCREENING LOWER EXTREMITY)   Coronary artery disease involving native coronary artery of native heart without angina pectoris - Primary   Relevant Medications   nicotine (NICODERM CQ - DOSED IN MG/24 HOURS) 21 mg/24hr patch   Other Relevant Orders   US ARTERIAL ABI (SCREENING LOWER EXTREMITY)     Other   Tobacco use   Relevant Medications   nicotine (NICODERM CQ - DOSED IN MG/24 HOURS) 21 mg/24hr patch   Other Relevant Orders   US ARTERIAL ABI (SCREENING LOWER EXTREMITY)   Other Visit Diagnoses     SOB (shortness of breath)       Relevant Medications   nicotine (NICODERM CQ - DOSED IN MG/24 HOURS) 21 mg/24hr patch   Other Relevant Orders   US ARTERIAL ABI (SCREENING LOWER EXTREMITY)   Claudication of both lower extremities (HCC)       Has pain in lgs and is smoker, wil do ABI   Relevant Medications   nicotine (NICODERM CQ - DOSED IN MG/24 HOURS) 21 mg/24hr patch   Other Relevant Orders   US ARTERIAL ABI (SCREENING LOWER EXTREMITY)          Disposition:   Return for  ABI and f/u.    Total time spent: 30 minutes  Signed,  Adrian Blackwater, MD  08/04/2022 10:52 AM    [facility]

## 2022-08-09 ENCOUNTER — Other Ambulatory Visit: Payer: 59

## 2022-08-16 ENCOUNTER — Telehealth: Payer: Self-pay | Admitting: Family

## 2022-08-16 NOTE — Telephone Encounter (Signed)
Patient left VM that she is out of her pain meds and needs them sent to pharmacy.

## 2022-08-19 ENCOUNTER — Other Ambulatory Visit: Payer: Self-pay

## 2022-08-20 MED ORDER — HYDROCODONE-ACETAMINOPHEN 5-325 MG PO TABS: 1.0000 | ORAL_TABLET | Freq: Two times a day (BID) | ORAL | 0 refills | Status: DC | PRN

## 2022-08-30 ENCOUNTER — Other Ambulatory Visit: Payer: Self-pay | Admitting: Cardiovascular Disease

## 2022-09-01 ENCOUNTER — Encounter: Payer: Self-pay | Admitting: Cardiology

## 2022-09-01 ENCOUNTER — Other Ambulatory Visit: Payer: Self-pay

## 2022-09-01 ENCOUNTER — Ambulatory Visit (INDEPENDENT_AMBULATORY_CARE_PROVIDER_SITE_OTHER): Payer: 59 | Admitting: Cardiology

## 2022-09-01 VITALS — BP 130/95 | HR 83 | Ht 65.0 in | Wt 166.4 lb

## 2022-09-01 DIAGNOSIS — E782 Mixed hyperlipidemia: Secondary | ICD-10-CM | POA: Diagnosis not present

## 2022-09-01 DIAGNOSIS — I739 Peripheral vascular disease, unspecified: Secondary | ICD-10-CM | POA: Diagnosis not present

## 2022-09-01 DIAGNOSIS — I2510 Atherosclerotic heart disease of native coronary artery without angina pectoris: Secondary | ICD-10-CM | POA: Diagnosis not present

## 2022-09-01 MED ORDER — ATORVASTATIN CALCIUM 80 MG PO TABS: 80.0000 mg | ORAL_TABLET | Freq: Every day | ORAL | 1 refills | Status: DC

## 2022-09-01 MED ORDER — VENTOLIN HFA 108 (90 BASE) MCG/ACT IN AERS: 2.0000 | INHALATION_SPRAY | RESPIRATORY_TRACT | 3 refills | Status: AC | PRN

## 2022-09-01 MED ORDER — TICAGRELOR 90 MG PO TABS: 90.0000 mg | ORAL_TABLET | Freq: Two times a day (BID) | ORAL | 1 refills | Status: DC

## 2022-09-01 MED ORDER — LOSARTAN POTASSIUM-HCTZ 50-12.5 MG PO TABS: 1.0000 | ORAL_TABLET | Freq: Every day | ORAL | 1 refills | Status: DC

## 2022-09-01 NOTE — Assessment & Plan Note (Signed)
Patient complaining of leg pain with exercise, relieved with rest. Reschedule ABIs. Return to discuss.

## 2022-09-01 NOTE — Progress Notes (Signed)
Cardiology Office Note   Date:  09/01/2022   ID:  Sabrina Barker, DOB 29-Jun-1950, MRN [id number]  PCP:  Miki Kins, FNP  Cardiologist:  Marisue Ivan, NP      History of Present Illness: Sabrina Barker is a 72 y.o. female who presents for  Chief Complaint  Patient presents with   Follow-up    4 WEEK F/U    Patient in office for 4 week follow up. Did not have ABIs done. Patient scheduled to have surgery on eyelids, will need cardiac clearance.  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   Depression    GERD (gastroesophageal reflux disease)    Heart murmur    Hyperlipidemia    Hypertension    Hypokalemia 01/30/2022   Hypomagnesemia 01/30/2022     Past Surgical History:  Procedure Laterality Date   ABDOMINAL HYSTERECTOMY     APPENDECTOMY     CORONARY STENT INTERVENTION N/A 03/20/2020   Procedure: CORONARY STENT INTERVENTION;  Surgeon: Yvonne Kendall, MD;  Location: [facility];  Service: Cardiovascular;  Laterality: N/A;   FACIAL COSMETIC SURGERY  2016   LAPAROSCOPY ABDOMEN DIAGNOSTIC  2016   removal of adhesions   LEFT HEART CATH AND CORONARY ANGIOGRAPHY N/A 03/20/2020   Procedure: LEFT HEART CATH AND CORONARY ANGIOGRAPHY with Intervention;  Surgeon: Laurier Nancy, MD;  Location: [facility];  Service: Cardiovascular;  Laterality: N/A;   rhinoplasty  2016   tummy tuck  2016   VASCULAR SURGERY       Current Outpatient Medications  Medication Sig Dispense Refill   aspirin EC 81 MG tablet Take 81 mg by mouth daily. Swallow whole.     Atogepant (QULIPTA) 60 MG TABS Take by mouth.     Biotin 5000 MCG CAPS Take by mouth.     buPROPion (WELLBUTRIN SR) 150 MG 12 hr tablet Take 150 mg by mouth daily.     Folic Acid (FOLATE PO) Take by mouth.     gabapentin (NEURONTIN) 400 MG  capsule Take 1 capsule (400 mg total) by mouth at bedtime. 90 capsule 1   Garlic 1000 MG CAPS Take by mouth daily.     HYDROcodone-acetaminophen (NORCO/VICODIN) 5-325 MG tablet Take 1 tablet by mouth 2 (two) times daily as needed for severe pain. 30 tablet 0   hydrOXYzine (ATARAX) 10 MG tablet Take 10 mg by mouth 3 (three) times daily as needed.     Iron Combinations (CHROMAGEN) capsule Take 1 capsule by mouth daily.     Melatonin 10 MG TABS Take by mouth.     Omega-3 Fatty Acids (FISH OIL) 300 MG CAPS Take by mouth.     pantoprazole (PROTONIX) 40 MG tablet Take 1 tablet (40 mg total) by mouth daily. 90 tablet 3   tiZANidine (ZANAFLEX) 4 MG tablet TAKE 1 TABLET BY MOUTH FOUR TIMES A DAY 360 tablet 3   traZODone (DESYREL) 100 MG tablet Take 100 mg by mouth at bedtime.     Zinc 50 MG TABS Take by mouth.     atorvastatin (LIPITOR) 80 MG tablet Take 1 tablet (80 mg total) by mouth daily. 90 tablet 1   losartan-hydrochlorothiazide (HYZAAR) 50-12.5 MG tablet Take 1 tablet by mouth daily. 90 tablet 1   ticagrelor (  BRILINTA) 90 MG TABS tablet Take 1 tablet (90 mg total) by mouth 2 (two) times daily. 180 tablet 1   VENTOLIN HFA 108 (90 Base) MCG/ACT inhaler Inhale 2 puffs into the lungs every 4 (four) hours as needed for wheezing or shortness of breath. 18 g 3   No current facility-administered medications for this visit.    Allergies:   Iodinated contrast media, Penicillins, Penicillins, and Shellfish allergy    Social History:   reports that she has been smoking cigarettes. She has never used smokeless tobacco. She reports that she does not drink alcohol and does not use drugs.   Family History:  family history includes Cancer in her mother; Heart disease in her brother, brother, father, and mother; Stroke in her mother.    ROS:     Review of Systems  Constitutional: Negative.   HENT: Negative.    Eyes: Negative.   Respiratory: Negative.    Cardiovascular: Negative.   Gastrointestinal:  Negative.   Genitourinary: Negative.   Musculoskeletal: Negative.   Skin: Negative.   Neurological: Negative.   Endo/Heme/Allergies: Negative.   Psychiatric/Behavioral: Negative.    All other systems reviewed and are negative.     All other systems are reviewed and negative.    PHYSICAL EXAM: VS:  BP (!) 130/95   Pulse 83   Ht 5\' 5"  (1.651 m)   Wt 166 lb 6.4 oz (75.5 kg)   SpO2 96%   BMI 27.69 kg/m  , BMI Body mass index is 27.69 kg/m. Last weight:  Wt Readings from Last 3 Encounters:  09/01/22 166 lb 6.4 oz (75.5 kg)  08/04/22 163 lb 12.8 oz (74.3 kg)  07/20/22 165 lb 9.6 oz (75.1 kg)     Physical Exam Vitals and nursing note reviewed.  Constitutional:      Appearance: Normal appearance. She is normal weight.  HENT:     Head: Normocephalic and atraumatic.     Nose: Nose normal.     Mouth/Throat:     Mouth: Mucous membranes are moist.     Pharynx: Oropharynx is clear.  Eyes:     Conjunctiva/sclera: Conjunctivae normal.     Pupils: Pupils are equal, round, and reactive to light.  Cardiovascular:     Rate and Rhythm: Normal rate and regular rhythm.     Pulses: Normal pulses.     Heart sounds: Normal heart sounds.  Pulmonary:     Effort: Pulmonary effort is normal.     Breath sounds: Normal breath sounds.  Abdominal:     General: Abdomen is flat. Bowel sounds are normal.     Palpations: Abdomen is soft.  Musculoskeletal:        General: Normal range of motion.     Cervical back: Normal range of motion.  Skin:    General: Skin is warm and dry.  Neurological:     General: No focal deficit present.     Mental Status: She is alert and oriented to person, place, and time. Mental status is at baseline.  Psychiatric:        Mood and Affect: Mood normal.        Behavior: Behavior normal.      EKG: none today  Recent Labs: 01/28/2022: Platelets 207 01/30/2022: Magnesium 2.0 07/20/2022: ALT 25; BUN 10; Creatinine, Ser 0.93; Hemoglobin 13.0; Potassium 3.7;  Sodium 143; TSH 0.390    Lipid Panel    Component Value Date/Time   CHOL 121 07/20/2022 1038   TRIG 106 07/20/2022 1038  HDL 49 07/20/2022 1038   CHOLHDL 2.5 07/20/2022 1038   CHOLHDL 3.8 03/20/2020 0227   VLDL 22 03/20/2020 0227   LDLCALC 52 07/20/2022 1038      Other studies Reviewed:   ASSESSMENT AND PLAN:    ICD-10-CM   1. Claudication of both lower extremities (HCC)  I73.9     2. Mixed hyperlipidemia  E78.2 atorvastatin (LIPITOR) 80 MG tablet    3. Coronary artery disease involving native coronary artery of native heart without angina pectoris  I25.10        Problem List Items Addressed This Visit       Cardiovascular and Mediastinum   Coronary artery disease involving native coronary artery of native heart without angina pectoris    Patient denies chest pain. Stented in 2022. May stop Brilinta for 7 days per surgeons instructions. Patient is low risk for procedure.       Relevant Medications   atorvastatin (LIPITOR) 80 MG tablet   losartan-hydrochlorothiazide (HYZAAR) 50-12.5 MG tablet     Other   Claudication of both lower extremities (HCC) - Primary    Patient complaining of leg pain with exercise, relieved with rest. Reschedule ABIs. Return to discuss.       Mixed hyperlipidemia   Relevant Medications   atorvastatin (LIPITOR) 80 MG tablet   losartan-hydrochlorothiazide (HYZAAR) 50-12.5 MG tablet     Disposition:   Return in about 4 weeks (around 09/29/2022) for after ABIs.    Total time spent: 30 minutes  Signed,  Google, NP  09/01/2022 12:22 PM    [facility]

## 2022-09-01 NOTE — Assessment & Plan Note (Addendum)
Patient denies chest pain. Stented in 2022. May stop Brilinta for 7 days per surgeons instructions. Patient is low risk for procedure.

## 2022-09-06 ENCOUNTER — Ambulatory Visit (INDEPENDENT_AMBULATORY_CARE_PROVIDER_SITE_OTHER): Payer: 59

## 2022-09-06 DIAGNOSIS — Z720 Tobacco use: Secondary | ICD-10-CM

## 2022-09-06 DIAGNOSIS — I10 Essential (primary) hypertension: Secondary | ICD-10-CM | POA: Diagnosis not present

## 2022-09-06 DIAGNOSIS — I2510 Atherosclerotic heart disease of native coronary artery without angina pectoris: Secondary | ICD-10-CM

## 2022-09-06 DIAGNOSIS — R0602 Shortness of breath: Secondary | ICD-10-CM

## 2022-09-06 DIAGNOSIS — I739 Peripheral vascular disease, unspecified: Secondary | ICD-10-CM

## 2022-09-12 ENCOUNTER — Other Ambulatory Visit: Payer: Self-pay | Admitting: Cardiovascular Disease

## 2022-09-12 DIAGNOSIS — R9439 Abnormal result of other cardiovascular function study: Secondary | ICD-10-CM

## 2022-09-12 DIAGNOSIS — I739 Peripheral vascular disease, unspecified: Secondary | ICD-10-CM

## 2022-09-19 ENCOUNTER — Encounter: Payer: Self-pay | Admitting: Cardiovascular Disease

## 2022-09-19 ENCOUNTER — Other Ambulatory Visit: Payer: Self-pay

## 2022-09-19 ENCOUNTER — Ambulatory Visit (INDEPENDENT_AMBULATORY_CARE_PROVIDER_SITE_OTHER): Payer: 59 | Admitting: Cardiovascular Disease

## 2022-09-19 VITALS — BP 150/90 | HR 87 | Ht 65.0 in | Wt 165.4 lb

## 2022-09-19 DIAGNOSIS — I214 Non-ST elevation (NSTEMI) myocardial infarction: Secondary | ICD-10-CM

## 2022-09-19 DIAGNOSIS — G8929 Other chronic pain: Secondary | ICD-10-CM

## 2022-09-19 DIAGNOSIS — E782 Mixed hyperlipidemia: Secondary | ICD-10-CM | POA: Diagnosis not present

## 2022-09-19 DIAGNOSIS — I10 Essential (primary) hypertension: Secondary | ICD-10-CM

## 2022-09-19 DIAGNOSIS — G894 Chronic pain syndrome: Secondary | ICD-10-CM

## 2022-09-19 DIAGNOSIS — F17210 Nicotine dependence, cigarettes, uncomplicated: Secondary | ICD-10-CM | POA: Diagnosis not present

## 2022-09-19 DIAGNOSIS — Z720 Tobacco use: Secondary | ICD-10-CM

## 2022-09-19 DIAGNOSIS — I739 Peripheral vascular disease, unspecified: Secondary | ICD-10-CM

## 2022-09-19 DIAGNOSIS — I2510 Atherosclerotic heart disease of native coronary artery without angina pectoris: Secondary | ICD-10-CM | POA: Diagnosis not present

## 2022-09-19 MED ORDER — LOSARTAN POTASSIUM-HCTZ 100-25 MG PO TABS: 1.0000 | ORAL_TABLET | Freq: Every day | ORAL | 11 refills | Status: DC

## 2022-09-19 MED ORDER — DIPHENHYDRAMINE HCL 50 MG PO TABS
ORAL_TABLET | ORAL | 0 refills | Status: AC
Start: 2022-09-19 — End: ?

## 2022-09-19 MED ORDER — PREDNISONE 50 MG PO TABS: ORAL_TABLET | ORAL | 0 refills | Status: DC

## 2022-09-19 NOTE — Progress Notes (Signed)
Cardiology Office Note   Date:  09/19/2022   ID:  Sabrina Barker, DOB 1950/12/20, MRN [id number]  PCP:  Sabrina Kins, FNP  Cardiologist:  Sabrina Blackwater, MD      History of Present Illness: Sabrina Barker is a 72 y.o. 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   Depression    GERD (gastroesophageal reflux disease)    Heart murmur    Hyperlipidemia    Hypertension    Hypokalemia 01/30/2022   Hypomagnesemia 01/30/2022     Past Surgical History:  Procedure Laterality Date   ABDOMINAL HYSTERECTOMY     APPENDECTOMY     CORONARY STENT INTERVENTION N/A 03/20/2020   Procedure: CORONARY STENT INTERVENTION;  Surgeon: Yvonne Kendall, MD;  Location: [facility];  Service: Cardiovascular;  Laterality: N/A;   FACIAL COSMETIC SURGERY  2016   LAPAROSCOPY ABDOMEN DIAGNOSTIC  2016   removal of adhesions   LEFT HEART CATH AND CORONARY ANGIOGRAPHY N/A 03/20/2020   Procedure: LEFT HEART CATH AND CORONARY ANGIOGRAPHY with Intervention;  Surgeon: Laurier Nancy, MD;  Location: [facility];  Service: Cardiovascular;  Laterality: N/A;   rhinoplasty  2016   tummy tuck  2016   VASCULAR SURGERY       Current Outpatient Medications  Medication Sig Dispense Refill   diphenhydrAMINE (BENADRYL) 50 MG tablet Take 1 tab night before and 1 tab 1 hour prior to cta, and 1 tab 4 hours after cta 3 tablet 0   losartan-hydrochlorothiazide (HYZAAR) 100-25 MG tablet Take 1 tablet by mouth daily. 30 tablet 11   predniSONE (DELTASONE) 50 MG tablet Take 1 tab night before and 1 tab day after cta aorta 2 tablet 0   aspirin EC 81 MG tablet Take 81 mg by mouth daily. Swallow whole.     Atogepant (QULIPTA) 60 MG TABS Take by mouth.      atorvastatin (LIPITOR) 80 MG tablet Take 1 tablet (80 mg total) by mouth daily. 90 tablet 1   Biotin 5000 MCG CAPS Take by mouth.     buPROPion (WELLBUTRIN SR) 150 MG 12 hr tablet Take 150 mg by mouth daily.     Folic Acid (FOLATE PO) Take by mouth.     gabapentin (NEURONTIN) 400 MG capsule Take 1 capsule (400 mg total) by mouth at bedtime. 90 capsule 1   Garlic 1000 MG CAPS Take by mouth daily.     HYDROcodone-acetaminophen (NORCO/VICODIN) 5-325 MG tablet Take 1 tablet by mouth 2 (two) times daily as needed for severe pain. 30 tablet 0   hydrOXYzine (ATARAX) 10 MG tablet Take 10 mg by mouth 3 (three) times daily as needed.     Iron Combinations (CHROMAGEN) capsule Take 1 capsule by mouth daily.     Melatonin 10 MG TABS Take by mouth.     Omega-3 Fatty Acids (FISH OIL) 300 MG CAPS Take by mouth.     pantoprazole (PROTONIX) 40 MG tablet Take 1 tablet (40 mg total) by mouth daily. 90 tablet 3   ticagrelor (BRILINTA) 90 MG TABS tablet Take 1 tablet (90 mg total) by mouth 2 (two) times daily. 180 tablet 1  tiZANidine (ZANAFLEX) 4 MG tablet TAKE 1 TABLET BY MOUTH FOUR TIMES A DAY 360 tablet 3   traZODone (DESYREL) 100 MG tablet Take 100 mg by mouth at bedtime.     VENTOLIN HFA 108 (90 Base) MCG/ACT inhaler Inhale 2 puffs into the lungs every 4 (four) hours as needed for wheezing or shortness of breath. 18 g 3   Zinc 50 MG TABS Take by mouth.     No current facility-administered medications for this visit.    Allergies:   Iodinated contrast media, Penicillins, Penicillins, and Shellfish allergy    Social History:   reports that she has been smoking cigarettes. She has never used smokeless tobacco. She reports that she does not drink alcohol and does not use drugs.   Family History:  family history includes Cancer in her mother; Heart disease in her brother, brother, father, and mother; Stroke in her mother.    ROS:     Review of Systems  Constitutional: Negative.   HENT: Negative.     Eyes: Negative.   Respiratory: Negative.    Gastrointestinal: Negative.   Genitourinary: Negative.   Musculoskeletal: Negative.   Skin: Negative.   Neurological: Negative.   Endo/Heme/Allergies: Negative.   Psychiatric/Behavioral: Negative.    All other systems reviewed and are negative.     All other systems are reviewed and negative.    PHYSICAL EXAM: VS:  BP (!) 150/90   Pulse 87   Ht 5\' 5"  (1.651 m)   Wt 165 lb 6.4 oz (75 kg)   SpO2 98%   BMI 27.52 kg/m  , BMI Body mass index is 27.52 kg/m. Last weight:  Wt Readings from Last 3 Encounters:  09/19/22 165 lb 6.4 oz (75 kg)  09/01/22 166 lb 6.4 oz (75.5 kg)  08/04/22 163 lb 12.8 oz (74.3 kg)     Physical Exam Constitutional:      Appearance: Normal appearance.  Cardiovascular:     Rate and Rhythm: Normal rate and regular rhythm.     Heart sounds: Normal heart sounds.  Pulmonary:     Effort: Pulmonary effort is normal.     Breath sounds: Normal breath sounds.  Musculoskeletal:     Right lower leg: No edema.     Left lower leg: No edema.  Neurological:     Mental Status: She is alert.       EKG:   Recent Labs: 01/28/2022: Platelets 207 01/30/2022: Magnesium 2.0 07/20/2022: ALT 25; BUN 10; Creatinine, Ser 0.93; Hemoglobin 13.0; Potassium 3.7; Sodium 143; TSH 0.390    Lipid Panel    Component Value Date/Time   CHOL 121 07/20/2022 1038   TRIG 106 07/20/2022 1038   HDL 49 07/20/2022 1038   CHOLHDL 2.5 07/20/2022 1038   CHOLHDL 3.8 03/20/2020 0227   VLDL 22 03/20/2020 0227   LDLCALC 52 07/20/2022 1038      Other studies Reviewed: Additional studies/ records that were reviewed today include:  Review of the above records demonstrates:       No data to display            ASSESSMENT AND PLAN:    ICD-10-CM   1. Coronary artery disease involving native coronary artery of native heart without angina pectoris  I25.10 losartan-hydrochlorothiazide (HYZAAR) 100-25 MG tablet    CT ANGIO AO+BIFEM  W & OR WO CONTRAST    diphenhydrAMINE (BENADRYL) 50 MG tablet    2. NSTEMI (non-ST elevated myocardial infarction) (HCC)  I21.4 losartan-hydrochlorothiazide (HYZAAR) 100-25 MG tablet  CT ANGIO AO+BIFEM W & OR WO CONTRAST    diphenhydrAMINE (BENADRYL) 50 MG tablet    3. Mixed hyperlipidemia  E78.2 losartan-hydrochlorothiazide (HYZAAR) 100-25 MG tablet    CT ANGIO AO+BIFEM W & OR WO CONTRAST    diphenhydrAMINE (BENADRYL) 50 MG tablet    4. Tobacco use  Z72.0 losartan-hydrochlorothiazide (HYZAAR) 100-25 MG tablet    CT ANGIO AO+BIFEM W & OR WO CONTRAST    diphenhydrAMINE (BENADRYL) 50 MG tablet    5. Essential hypertension  I10 losartan-hydrochlorothiazide (HYZAAR) 100-25 MG tablet    CT ANGIO AO+BIFEM W & OR WO CONTRAST    diphenhydrAMINE (BENADRYL) 50 MG tablet   increase hyzaar 100/25    6. PVD (peripheral vascular disease) (HCC)  I73.9 losartan-hydrochlorothiazide (HYZAAR) 100-25 MG tablet    CT ANGIO AO+BIFEM W & OR WO CONTRAST    diphenhydrAMINE (BENADRYL) 50 MG tablet   ABI right side .,. Advise CTA aorta75, left .59       Problem List Items Addressed This Visit       Cardiovascular and Mediastinum   NSTEMI (non-ST elevated myocardial infarction) (HCC)   Relevant Medications   losartan-hydrochlorothiazide (HYZAAR) 100-25 MG tablet   diphenhydrAMINE (BENADRYL) 50 MG tablet   Other Relevant Orders   CT ANGIO AO+BIFEM W & OR WO CONTRAST   Coronary artery disease involving native coronary artery of native heart without angina pectoris - Primary   Relevant Medications   losartan-hydrochlorothiazide (HYZAAR) 100-25 MG tablet   diphenhydrAMINE (BENADRYL) 50 MG tablet   Other Relevant Orders   CT ANGIO AO+BIFEM W & OR WO CONTRAST     Other   Tobacco use   Relevant Medications   losartan-hydrochlorothiazide (HYZAAR) 100-25 MG tablet   diphenhydrAMINE (BENADRYL) 50 MG tablet   Other Relevant Orders   CT ANGIO AO+BIFEM W & OR WO CONTRAST   Mixed hyperlipidemia    Relevant Medications   losartan-hydrochlorothiazide (HYZAAR) 100-25 MG tablet   diphenhydrAMINE (BENADRYL) 50 MG tablet   Other Relevant Orders   CT ANGIO AO+BIFEM W & OR WO CONTRAST   Other Visit Diagnoses     Essential hypertension       increase hyzaar 100/25   Relevant Medications   losartan-hydrochlorothiazide (HYZAAR) 100-25 MG tablet   diphenhydrAMINE (BENADRYL) 50 MG tablet   Other Relevant Orders   CT ANGIO AO+BIFEM W & OR WO CONTRAST   PVD (peripheral vascular disease) (HCC)       ABI right side .,. Advise CTA aorta75, left .59   Relevant Medications   losartan-hydrochlorothiazide (HYZAAR) 100-25 MG tablet   diphenhydrAMINE (BENADRYL) 50 MG tablet   Other Relevant Orders   CT ANGIO AO+BIFEM W & OR WO CONTRAST          Disposition:   Return in about 2 weeks (around 10/03/2022) for cta aorta.    Total time spent: 40 minutes  Signed,  Sabrina Blackwater, MD  09/19/2022 9:52 AM    [facility]

## 2022-09-22 MED ORDER — HYDROCODONE-ACETAMINOPHEN 5-325 MG PO TABS: 1.0000 | ORAL_TABLET | Freq: Two times a day (BID) | ORAL | 0 refills | Status: DC | PRN

## 2022-09-26 ENCOUNTER — Other Ambulatory Visit: Payer: Self-pay

## 2022-09-29 NOTE — Telephone Encounter (Signed)
Patient called stating that [facility] doesn't have any hydrocodone in stock and not sure when they will have any & asking for it to [facility] in [place] instead. Please adv

## 2022-10-03 ENCOUNTER — Other Ambulatory Visit: Payer: Self-pay | Admitting: Cardiology

## 2022-10-03 ENCOUNTER — Other Ambulatory Visit: Payer: Self-pay

## 2022-10-03 MED ORDER — HYDROCODONE-ACETAMINOPHEN 5-325 MG PO TABS: 1.0000 | ORAL_TABLET | Freq: Two times a day (BID) | ORAL | 0 refills | Status: DC | PRN

## 2022-10-04 ENCOUNTER — Ambulatory Visit: Admission: RE | Admit: 2022-10-04 | Payer: 59 | Source: Ambulatory Visit | Admitting: *Deleted

## 2022-10-04 ENCOUNTER — Telehealth: Payer: Self-pay | Admitting: Family

## 2022-10-04 ENCOUNTER — Ambulatory Visit
Admission: RE | Admit: 2022-10-04 | Discharge: 2022-10-04 | Disposition: A | Discharge status: Home / Self Care | Payer: 59 | Source: Ambulatory Visit | Attending: Family | Admitting: Family

## 2022-10-04 ENCOUNTER — Ambulatory Visit (INDEPENDENT_AMBULATORY_CARE_PROVIDER_SITE_OTHER): Payer: 59

## 2022-10-04 ENCOUNTER — Ambulatory Visit: Payer: 59

## 2022-10-04 ENCOUNTER — Ambulatory Visit (INDEPENDENT_AMBULATORY_CARE_PROVIDER_SITE_OTHER): Payer: 59 | Admitting: Family

## 2022-10-04 VITALS — BP 170/90 | HR 88 | Ht 65.0 in | Wt 165.6 lb

## 2022-10-04 DIAGNOSIS — Y92009 Unspecified place in unspecified non-institutional (private) residence as the place of occurrence of the external cause: Secondary | ICD-10-CM | POA: Diagnosis not present

## 2022-10-04 DIAGNOSIS — I2510 Atherosclerotic heart disease of native coronary artery without angina pectoris: Secondary | ICD-10-CM | POA: Diagnosis not present

## 2022-10-04 DIAGNOSIS — E782 Mixed hyperlipidemia: Secondary | ICD-10-CM

## 2022-10-04 DIAGNOSIS — R0789 Other chest pain: Secondary | ICD-10-CM

## 2022-10-04 DIAGNOSIS — I214 Non-ST elevation (NSTEMI) myocardial infarction: Secondary | ICD-10-CM

## 2022-10-04 DIAGNOSIS — Z720 Tobacco use: Secondary | ICD-10-CM

## 2022-10-04 DIAGNOSIS — I739 Peripheral vascular disease, unspecified: Secondary | ICD-10-CM

## 2022-10-04 DIAGNOSIS — I10 Essential (primary) hypertension: Secondary | ICD-10-CM

## 2022-10-04 MED ORDER — IOHEXOL 350 MG/ML SOLN
125.0000 mL | Freq: Once | INTRAVENOUS | Status: AC | PRN
  Administered 2022-10-04: 125 mL via INTRAVENOUS

## 2022-10-04 NOTE — Telephone Encounter (Signed)
Pt states the script wasn't received at [facility]. Script for hydrocodone [facility] on [address] in [place]

## 2022-10-07 ENCOUNTER — Ambulatory Visit: Payer: 59 | Admitting: Cardiovascular Disease

## 2022-10-07 ENCOUNTER — Ambulatory Visit (INDEPENDENT_AMBULATORY_CARE_PROVIDER_SITE_OTHER): Payer: 59 | Admitting: Cardiovascular Disease

## 2022-10-07 ENCOUNTER — Other Ambulatory Visit: Payer: Self-pay

## 2022-10-07 ENCOUNTER — Encounter: Payer: Self-pay | Admitting: Cardiovascular Disease

## 2022-10-07 VITALS — BP 144/67 | HR 86 | Ht 65.0 in | Wt 166.4 lb

## 2022-10-07 DIAGNOSIS — R0602 Shortness of breath: Secondary | ICD-10-CM

## 2022-10-07 DIAGNOSIS — I10 Essential (primary) hypertension: Secondary | ICD-10-CM | POA: Diagnosis not present

## 2022-10-07 DIAGNOSIS — I2510 Atherosclerotic heart disease of native coronary artery without angina pectoris: Secondary | ICD-10-CM

## 2022-10-07 DIAGNOSIS — F17210 Nicotine dependence, cigarettes, uncomplicated: Secondary | ICD-10-CM

## 2022-10-07 DIAGNOSIS — I739 Peripheral vascular disease, unspecified: Secondary | ICD-10-CM

## 2022-10-07 DIAGNOSIS — E782 Mixed hyperlipidemia: Secondary | ICD-10-CM

## 2022-10-07 MED ORDER — TICAGRELOR 90 MG PO TABS: 90.0000 mg | ORAL_TABLET | Freq: Two times a day (BID) | ORAL | 1 refills | Status: DC

## 2022-10-07 NOTE — Progress Notes (Signed)
Cardiology Office Note   Date:  10/07/2022   ID:  Sabrina Barker, DOB May 01, 1950, MRN [id number]  PCP:  Miki Kins, FNP  Cardiologist:  Adrian Blackwater, MD      History of Present Illness: Sabrina Barker is a 72 y.o. female who presents for  Chief Complaint  Patient presents with   Follow-up    Has pain in right lower abdomen, but seen by Marchelle Folks and x-rays.      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   Depression    GERD (gastroesophageal reflux disease)    Heart murmur    Hyperlipidemia    Hypertension    Hypokalemia 01/30/2022   Hypomagnesemia 01/30/2022     Past Surgical History:  Procedure Laterality Date   ABDOMINAL HYSTERECTOMY     APPENDECTOMY     CORONARY STENT INTERVENTION N/A 03/20/2020   Procedure: CORONARY STENT INTERVENTION;  Surgeon: Yvonne Kendall, MD;  Location: [facility];  Service: Cardiovascular;  Laterality: N/A;   FACIAL COSMETIC SURGERY  2016   LAPAROSCOPY ABDOMEN DIAGNOSTIC  2016   removal of adhesions   LEFT HEART CATH AND CORONARY ANGIOGRAPHY N/A 03/20/2020   Procedure: LEFT HEART CATH AND CORONARY ANGIOGRAPHY with Intervention;  Surgeon: Laurier Nancy, MD;  Location: [facility];  Service: Cardiovascular;  Laterality: N/A;   rhinoplasty  2016   tummy tuck  2016   VASCULAR SURGERY       Current Outpatient Medications  Medication Sig Dispense Refill   aspirin EC 81 MG tablet Take 81 mg by mouth daily. Swallow whole.     Atogepant (QULIPTA) 60 MG TABS Take by mouth.     atorvastatin (LIPITOR) 80 MG tablet Take 1 tablet (80 mg total) by mouth daily. 90 tablet 1   Biotin 5000 MCG CAPS Take by mouth.     buPROPion (WELLBUTRIN SR) 150 MG 12 hr tablet Take 150 mg by mouth daily.     diphenhydrAMINE (BENADRYL) 50 MG tablet Take 1 tab night before and 1 tab 1 hour prior to  cta, and 1 tab 4 hours after cta 3 tablet 0   Folic Acid (FOLATE PO) Take by mouth.     furosemide (LASIX) 20 MG tablet TAKE 1 TABLET BY MOUTH EVERY DAY 90 tablet 0   gabapentin (NEURONTIN) 400 MG capsule Take 1 capsule (400 mg total) by mouth at bedtime. 90 capsule 1   Garlic 1000 MG CAPS Take by mouth daily.     HYDROcodone-acetaminophen (NORCO/VICODIN) 5-325 MG tablet Take 1 tablet by mouth 2 (two) times daily as needed for severe pain. 30 tablet 0   hydrOXYzine (ATARAX) 10 MG tablet Take 10 mg by mouth 3 (three) times daily as needed.     Iron Combinations (CHROMAGEN) capsule Take 1 capsule by mouth daily.     losartan-hydrochlorothiazide (HYZAAR) 100-25 MG tablet Take 1 tablet by mouth daily. 30 tablet 11   Melatonin 10 MG TABS Take by mouth.     Omega-3 Fatty Acids (FISH OIL) 300 MG CAPS Take by mouth.     pantoprazole (PROTONIX) 40 MG tablet Take 1 tablet (40 mg total) by mouth daily. 90 tablet 3   predniSONE (DELTASONE) 50 MG tablet Take 1 tab night before and 1 tab day after cta aorta 2 tablet 0   ticagrelor (  BRILINTA) 90 MG TABS tablet Take 1 tablet (90 mg total) by mouth 2 (two) times daily. 180 tablet 1   tiZANidine (ZANAFLEX) 4 MG tablet TAKE 1 TABLET BY MOUTH FOUR TIMES A DAY 360 tablet 3   traZODone (DESYREL) 100 MG tablet Take 100 mg by mouth at bedtime.     VENTOLIN HFA 108 (90 Base) MCG/ACT inhaler Inhale 2 puffs into the lungs every 4 (four) hours as needed for wheezing or shortness of breath. 18 g 3   Zinc 50 MG TABS Take by mouth.     No current facility-administered medications for this visit.    Allergies:   Iodinated contrast media, Penicillins, Penicillins, and Shellfish allergy    Social History:   reports that she has been smoking cigarettes. She has never used smokeless tobacco. She reports that she does not drink alcohol and does not use drugs.   Family History:  family history includes Cancer in her mother; Heart disease in her brother, brother, father, and  mother; Stroke in her mother.    ROS:     Review of Systems  Constitutional: Negative.   HENT: Negative.    Eyes: Negative.   Respiratory: Negative.    Gastrointestinal: Negative.   Genitourinary: Negative.   Musculoskeletal: Negative.   Skin: Negative.   Neurological: Negative.   Endo/Heme/Allergies: Negative.   Psychiatric/Behavioral: Negative.    All other systems reviewed and are negative.     All other systems are reviewed and negative.    PHYSICAL EXAM: VS:  BP (!) 144/67   Pulse 86   Ht 5\' 5"  (1.651 m)   Wt 166 lb 6.4 oz (75.5 kg)   SpO2 96%   BMI 27.69 kg/m  , BMI Body mass index is 27.69 kg/m. Last weight:  Wt Readings from Last 3 Encounters:  10/07/22 166 lb 6.4 oz (75.5 kg)  10/04/22 165 lb 9.6 oz (75.1 kg)  09/19/22 165 lb 6.4 oz (75 kg)     Physical Exam Constitutional:      Appearance: Normal appearance.  Cardiovascular:     Rate and Rhythm: Normal rate and regular rhythm.     Heart sounds: Normal heart sounds.  Pulmonary:     Effort: Pulmonary effort is normal.     Breath sounds: Normal breath sounds.  Musculoskeletal:     Right lower leg: No edema.     Left lower leg: No edema.  Neurological:     Mental Status: She is alert.       EKG:   Recent Labs: 01/28/2022: Platelets 207 01/30/2022: Magnesium 2.0 07/20/2022: ALT 25; BUN 10; Creatinine, Ser 0.93; Hemoglobin 13.0; Potassium 3.7; Sodium 143; TSH 0.390    Lipid Panel    Component Value Date/Time   CHOL 121 07/20/2022 1038   TRIG 106 07/20/2022 1038   HDL 49 07/20/2022 1038   CHOLHDL 2.5 07/20/2022 1038   CHOLHDL 3.8 03/20/2020 0227   VLDL 22 03/20/2020 0227   LDLCALC 52 07/20/2022 1038      Other studies Reviewed: Additional studies/ records that were reviewed today include:  Review of the above records demonstrates:       No data to display            ASSESSMENT AND PLAN:    ICD-10-CM   1. Coronary artery disease involving native coronary artery of native  heart without angina pectoris  I25.10    stable    2. Essential hypertension, benign  I10     3. Mixed  hyperlipidemia  E78.2     4. SOB (shortness of breath)  R06.02     5. Claudication of both lower extremities (HCC)  I73.9     6. PVD (peripheral vascular disease) (HCC)  I73.9    There is pcalcified plaque in Aorta and branches without any obstructive disease, treat medically       Problem List Items Addressed This Visit       Cardiovascular and Mediastinum   Essential hypertension, benign   Coronary artery disease involving native coronary artery of native heart without angina pectoris - Primary     Other   Claudication of both lower extremities (HCC)   Mixed hyperlipidemia   Other Visit Diagnoses     SOB (shortness of breath)       PVD (peripheral vascular disease) (HCC)       There is pcalcified plaque in Aorta and branches without any obstructive disease, treat medically          Disposition:   Return in about 2 months (around 12/07/2022).    Total time spent: 40 minutes  Signed,  Adrian Blackwater, MD  10/07/2022 9:49 AM    [facility]

## 2022-10-20 ENCOUNTER — Encounter: Payer: Self-pay | Admitting: Family
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
## 2022-10-24 ENCOUNTER — Telehealth: Payer: Self-pay

## 2022-10-24 NOTE — Telephone Encounter (Signed)
Pt left a voicemail requesting more pain medication as she is still in a good amount af pain- please advise

## 2022-10-25 ENCOUNTER — Other Ambulatory Visit: Payer: Self-pay

## 2022-10-25 MED ORDER — HYDROCODONE-ACETAMINOPHEN 5-325 MG PO TABS: 1.0000 | ORAL_TABLET | Freq: Two times a day (BID) | ORAL | 0 refills | Status: DC | PRN

## 2022-10-25 NOTE — Telephone Encounter (Signed)
Sent request to provider

## 2022-11-13 IMAGING — MR MR CERVICAL SPINE W/O CM
5 series · 39 of 48 positions shown · non-contrast
Comparison: None.

CLINICAL DATA: Low back pain extending into the left leg.

EXAM:
MRI CERVICAL SPINE WITHOUT CONTRAST
TECHNIQUE: Multiplanar, multisequence MR imaging of the cervical spine was
performed. No intravenous contrast was administered.

[Series 5: T2 · sagittal · 3.0mm · 0.62mm/px · 6 of 15 slices shown (1 of 2)]
[im 1/15]
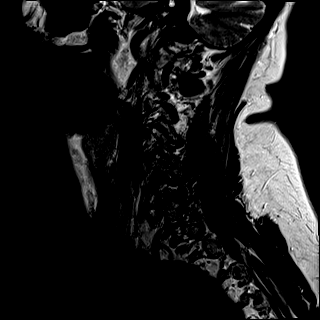
[im 3/15]
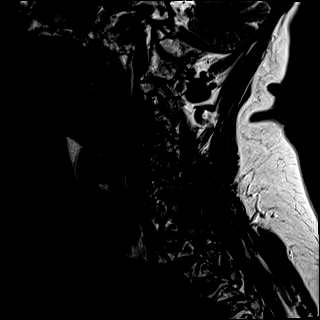
[im 6/15]
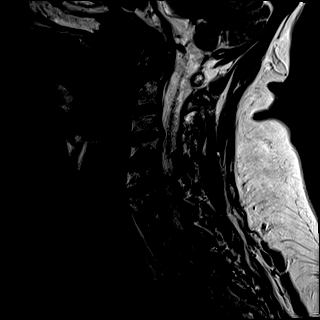
[im 9/15]
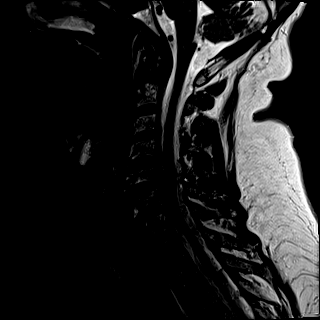
[im 12/15]
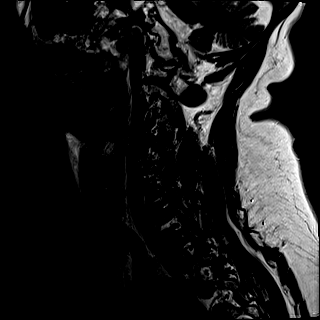
[im 15/15]
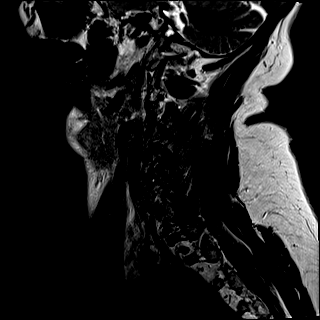

[Series 6: FLAIR · sagittal · 3.0mm · 0.78mm/px · 7 of 15 slices shown]
[im 1/15]
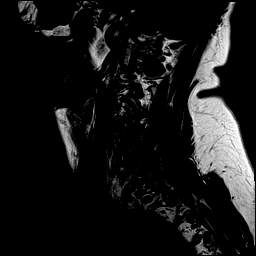
[im 3/15]
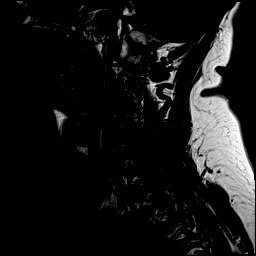
[im 5/15]
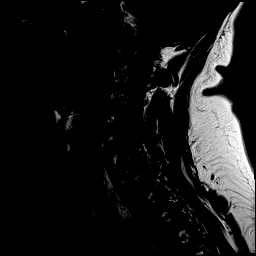
[im 8/15]
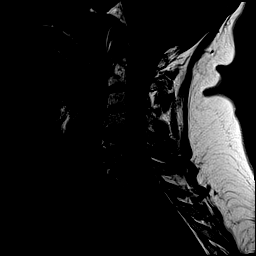
[im 10/15]
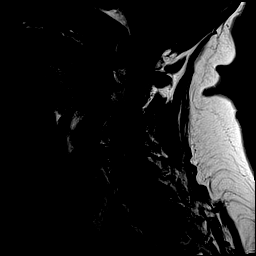
[im 12/15]
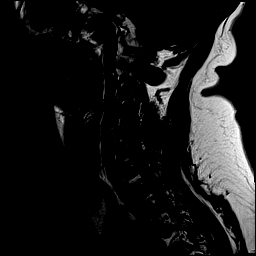
[im 15/15]
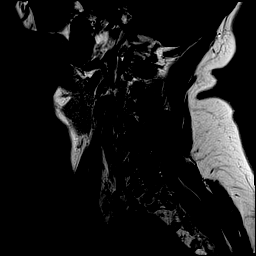

[Series 7: STIR · sagittal · 3.0mm · 0.62mm/px · 7 of 15 slices shown]
[im 1/15]
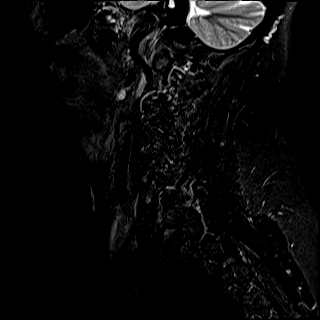
[im 3/15]
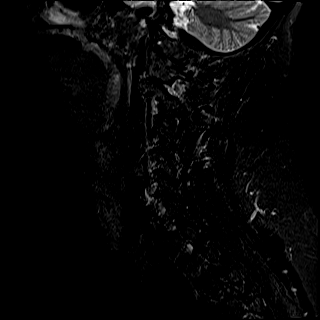
[im 5/15]
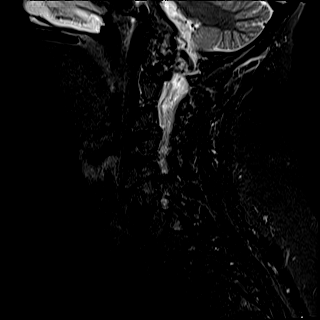
[im 8/15]
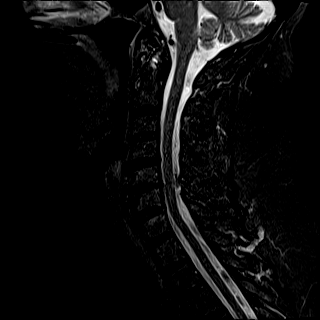
[im 10/15]
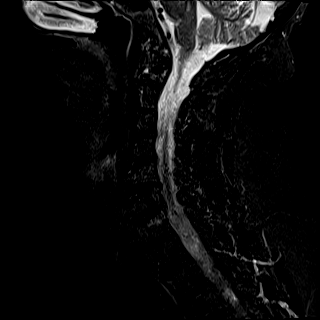
[im 12/15]
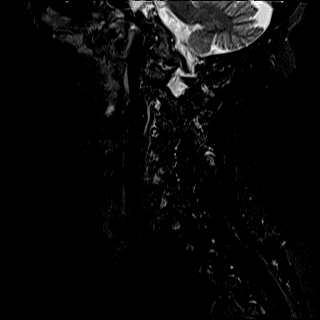
[im 15/15]
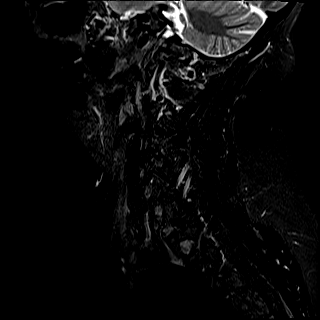

[Series 8: T2 · axial · 3.0mm · 0.70mm/px · z∈[-84,+12]mm · 11 of 29 slices shown (2 of 2)]
[im 1/29]
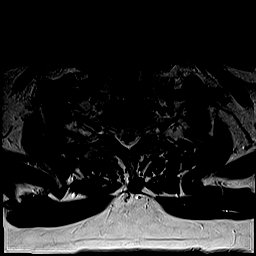
[im 3/29]
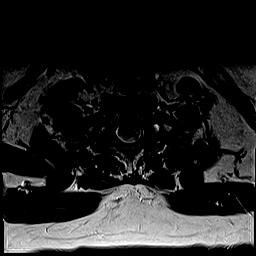
[im 5/29]
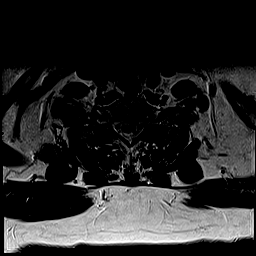
[im 7/29]
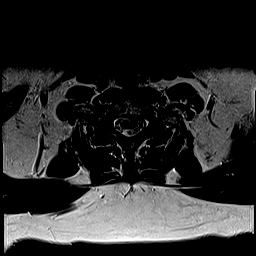
[im 9/29]
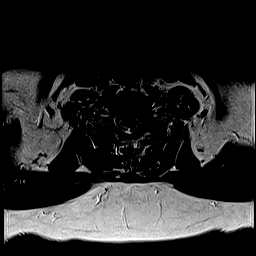
[im 11/29]
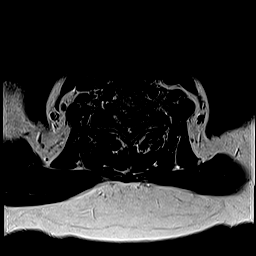
[im 13/29]
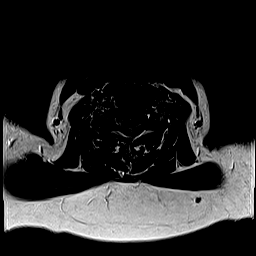
[im 16/29]
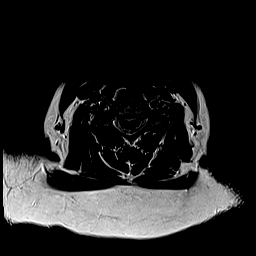
[im 20/29]
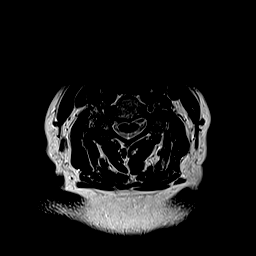
[im 24/29]
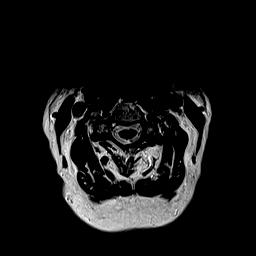
[im 29/29]
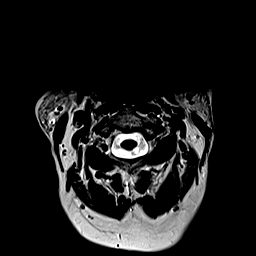

[Series 9: ax mpgr · axial · 3.0mm · 0.35mm/px · z∈[-84,+12]mm · 8 of 29 slices shown]
[im 1/29]
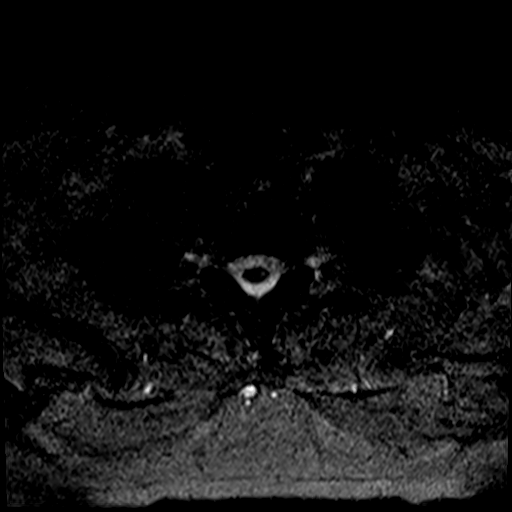
[im 5/29]
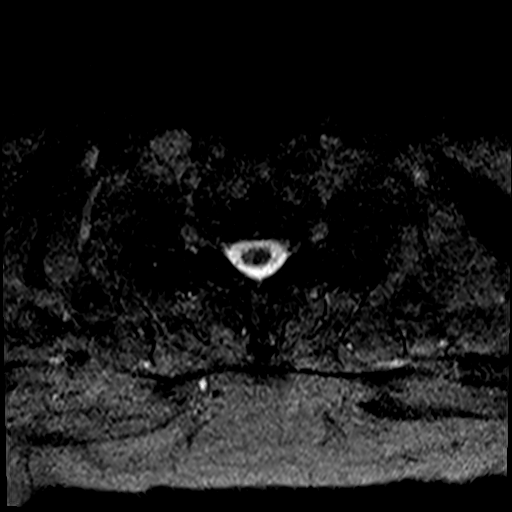
[im 9/29]
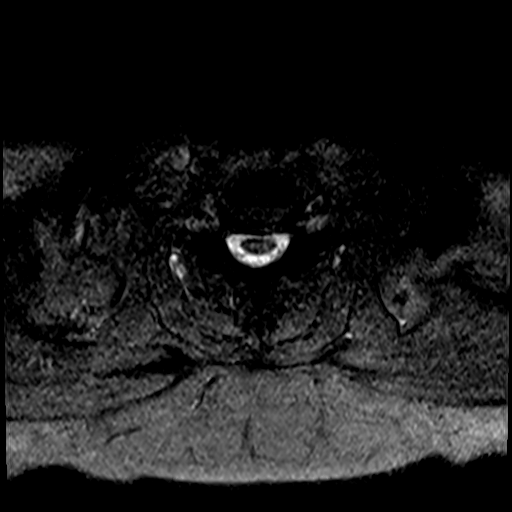
[im 13/29]
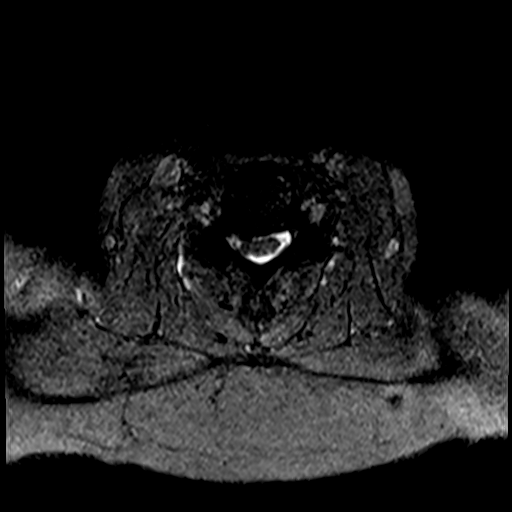
[im 16/29]
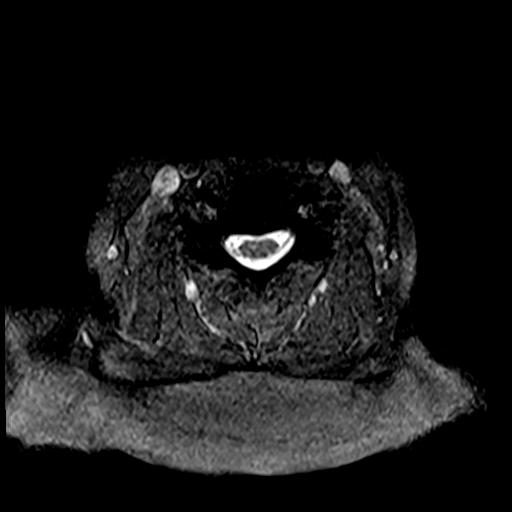
[im 20/29]
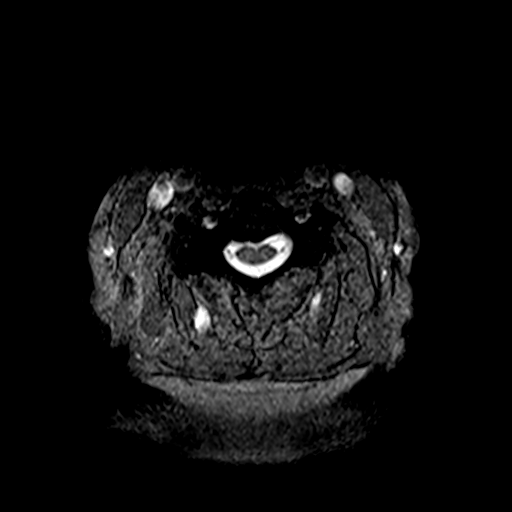
[im 24/29]
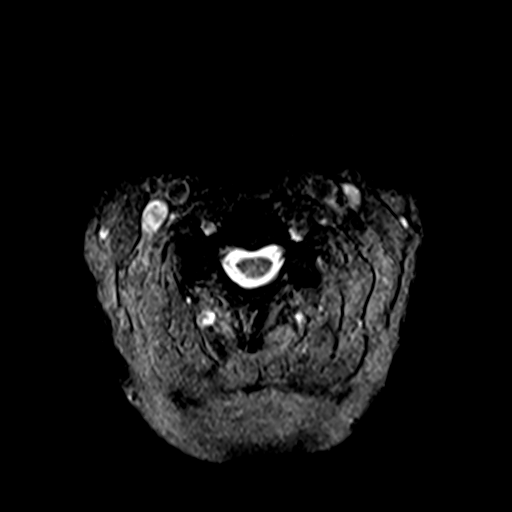
[im 29/29]
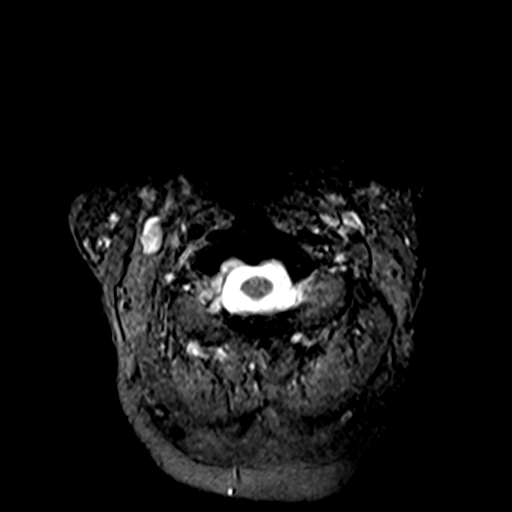

[39 of 48 positions shown; findings below may reference images not displayed]

FINDINGS: Alignment: Grade 1 anterolisthesis at L4-5, degenerative.

Vertebrae: No fracture, evidence of discitis, or bone lesion. Mild
discogenic endplate edema at the C7 superior endplate.

Cord: Normal

Posterior Fossa, vertebral arteries, paraspinal tissues: Negative

Disc levels:

C1-2: Iso- to hypointense pannus appearance extending superiorly
from the atlantodental joint with mild erosion and edema seen at the
tip of the dens.

C2-3: Degenerative facet spurring bilaterally

C3-4: Degenerative facet spurring on the right more than left with
asymmetric right uncovertebral spurring and advanced right foraminal
impingement

C4-5: Degenerative facet spurring which is at least moderate.
Bulging of the disc with mild right foraminal narrowing

C5-6: Disc narrowing and bulging with uncovertebral and facet
spurring asymmetric to the right where there is severe foraminal
impingement. Mild spinal stenosis

C6-7: Disc narrowing and bulging. Posterior annular fissure.
Negative facets.

C7-T1:Unremarkable.
IMPRESSION: 1. Multilevel disc and facet degeneration with asymmetric
right-sided uncovertebral and facet spurring. Right foraminal
impingement that is severe at C3-4 and C5-6.
2. Pannus and inflammatory/erosive changes suggested at the
atlantodental joint. Is there history of inflammatory arthritis.

## 2022-11-14 ENCOUNTER — Ambulatory Visit (INDEPENDENT_AMBULATORY_CARE_PROVIDER_SITE_OTHER): Payer: 59 | Admitting: Family

## 2022-11-14 VITALS — BP 140/65 | HR 73 | Ht 65.0 in | Wt 162.0 lb

## 2022-11-14 DIAGNOSIS — J449 Chronic obstructive pulmonary disease, unspecified: Secondary | ICD-10-CM | POA: Diagnosis not present

## 2022-11-14 DIAGNOSIS — M533 Sacrococcygeal disorders, not elsewhere classified: Secondary | ICD-10-CM | POA: Diagnosis not present

## 2022-11-14 DIAGNOSIS — M5442 Lumbago with sciatica, left side: Secondary | ICD-10-CM | POA: Diagnosis not present

## 2022-11-14 DIAGNOSIS — M5441 Lumbago with sciatica, right side: Secondary | ICD-10-CM | POA: Diagnosis not present

## 2022-11-14 DIAGNOSIS — G8929 Other chronic pain: Secondary | ICD-10-CM

## 2022-11-18 NOTE — Progress Notes (Signed)
Referring Physician:  Miki Kins, FNP [address] [place],  [place] [address]  Primary Physician:  Miki Kins, FNP  History of Present Illness: 11/23/2022 Sabrina Barker has a history of cardiac stent, HTN, and anxiety, MI, CAD, COPD, hyperlipidemia, and bipolar.   Has seen Dr. Mariah Milling in the past (last visit 07/28/20) for chronic neck and back pain x years. Lumbar and cervical injections were discussed, but it does not appear they were done.   She has 14+ year history of constant LBP with constant bilateral lateral leg pain to her feet that is worse with walking, standing, cleaning. She does better with a grocery cart. She has diffuse pain in her back from shoulders down. LBP > leg pain. Some relief with sitting. She has numbness and tingling in her legs. She has weakness in the legs.   She has constant neck pain. No arm pain. She has numbness and tingling in her arms. She notes worsening dexterity issues and balance issues.   She is taking neurontin, norco, and zanaflex.   She in on [place]. She has allergy to contrast.   Bowel/Bladder Dysfunction: none  Conservative measures:  Physical therapy: has not participated in  Multimodal medical therapy including regular antiinflammatories: neurontin, norco, zanaflex  Injections: Had injections back in 2000 (neck and back) with some relief.   Past Surgery: no previous spinal surgeries   Agam Raudenbush has symptoms of cervical myelopathy.  The symptoms are causing a significant impact on the patient's life.   Review of Systems:  A 10 point review of systems is negative, except for the pertinent positives and negatives detailed in the HPI.  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   Depression    GERD (gastroesophageal reflux disease)    Heart murmur     Hyperlipidemia    Hypertension    Hypokalemia 01/30/2022   Hypomagnesemia 01/30/2022    Past Surgical History: Past Surgical History:  Procedure Laterality Date   ABDOMINAL HYSTERECTOMY     APPENDECTOMY     CORONARY STENT INTERVENTION N/A 03/20/2020   Procedure: CORONARY STENT INTERVENTION;  Surgeon: Yvonne Kendall, MD;  Location: [facility] INVASIVE CV LAB;  Service: Cardiovascular;  Laterality: N/A;   FACIAL COSMETIC SURGERY  2016   LAPAROSCOPY ABDOMEN DIAGNOSTIC  2016   removal of adhesions   LEFT HEART CATH AND CORONARY ANGIOGRAPHY N/A 03/20/2020   Procedure: LEFT HEART CATH AND CORONARY ANGIOGRAPHY with Intervention;  Surgeon: Laurier Nancy, MD;  Location: [facility] INVASIVE CV LAB;  Service: Cardiovascular;  Laterality: N/A;   rhinoplasty  2016   tummy tuck  2016   VASCULAR SURGERY      Allergies: Allergies as of 11/23/2022 - Review Complete 11/23/2022  Allergen Reaction Noted   Iodinated contrast media Anaphylaxis 01/26/2022   Penicillins Shortness Of Breath and Swelling 09/21/2016   Penicillins Anaphylaxis, Shortness Of Breath, and Swelling 09/21/2016   Shellfish allergy Anaphylaxis 11/17/2016    Medications: Outpatient Encounter Medications as of 11/23/2022  Medication Sig   aspirin EC 81 MG tablet Take 81 mg by mouth daily. Swallow whole.   atorvastatin (LIPITOR) 80 MG tablet Take 1 tablet (80 mg total) by mouth daily.   Biotin 5000 MCG CAPS Take by mouth.   buPROPion (WELLBUTRIN SR) 150 MG 12 hr tablet Take 150 mg by  mouth daily.   diphenhydrAMINE (BENADRYL) 50 MG tablet Take 1 tab night before and 1 tab 1 hour prior to cta, and 1 tab 4 hours after cta   Folic Acid (FOLATE PO) Take by mouth.   furosemide (LASIX) 20 MG tablet TAKE 1 TABLET BY MOUTH EVERY DAY   gabapentin (NEURONTIN) 400 MG capsule Take 1 capsule (400 mg total) by mouth at bedtime.   Garlic 1000 MG CAPS Take by mouth daily.   HYDROcodone-acetaminophen (NORCO/VICODIN) 5-325 MG tablet Take 1 tablet by  mouth 2 (two) times daily as needed for severe pain.   hydrOXYzine (ATARAX) 10 MG tablet Take 10 mg by mouth 3 (three) times daily as needed.   Iron Combinations (CHROMAGEN) capsule Take 1 capsule by mouth daily.   losartan-hydrochlorothiazide (HYZAAR) 100-25 MG tablet Take 1 tablet by mouth daily.   Melatonin 10 MG TABS Take by mouth.   Omega-3 Fatty Acids (FISH OIL) 300 MG CAPS Take by mouth.   pantoprazole (PROTONIX) 40 MG tablet Take 1 tablet (40 mg total) by mouth daily.   predniSONE (DELTASONE) 50 MG tablet Take 1 tab night before and 1 tab day after cta aorta   ticagrelor (BRILINTA) 90 MG TABS tablet Take 1 tablet (90 mg total) by mouth 2 (two) times daily.   tiZANidine (ZANAFLEX) 4 MG tablet TAKE 1 TABLET BY MOUTH FOUR TIMES A DAY   traZODone (DESYREL) 100 MG tablet Take 100 mg by mouth at bedtime.   VENTOLIN HFA 108 (90 Base) MCG/ACT inhaler Inhale 2 puffs into the lungs every 4 (four) hours as needed for wheezing or shortness of breath.   Zinc 50 MG TABS Take by mouth.   [DISCONTINUED] Atogepant (QULIPTA) 60 MG TABS Take by mouth. (Patient not taking: Reported on 11/23/2022)   No facility-administered encounter medications on file as of 11/23/2022.    Social History: Social History   Tobacco Use   Smoking status: Every Day    Current packs/day: 0.15    Types: Cigarettes   Smokeless tobacco: Never   Tobacco comments:    3-4 cig/day  Substance Use Topics   Alcohol use: No   Drug use: Never    Family Medical History: Family History  Problem Relation Age of Onset   Heart disease Mother    Stroke Mother    Cancer Mother    Heart disease Father    Heart disease Brother    Heart disease Brother     Physical Examination: Vitals:   11/23/22 1013  BP: 130/78    General: Patient is well developed, well nourished, calm, collected, and in no apparent distress. Attention to examination is appropriate.  Respiratory: Patient is breathing without any  difficulty.   NEUROLOGICAL:     Awake, alert, oriented to person, place, and time.  Speech is clear and fluent. Fund of knowledge is appropriate.   Cranial Nerves: Pupils equal round and reactive to light.  Facial tone is symmetric.    She has diffuse lower posterior cervical tenderness.   Minimal posterior tenderness in thoracic spine.   She has diffuse lower lumbar tenderness.   No abnormal lesions on exposed skin.   Strength: Side Biceps Triceps Deltoid Interossei Grip Wrist Ext. Wrist Flex.  R 5 5 5 5 5 5 5   L 5 5 5 5 5 5 5    Side Iliopsoas Quads Hamstring PF DF EHL  R 5 5 5 5 5 5   L 5 5 5 5 5 5    Reflexes are 2+  and symmetric at the biceps, brachioradialis, patella and achilles.   Hoffman's is positive on left, negative on right She has 1-2 beats of clonus on right LE, none on left LE.    Bilateral upper and lower extremity sensation is intact to light touch.     She describes an intention tremor in both arms, but I don't appreciate this today.   Gait is unsteady. She cannot heel/toe walk.   Medical Decision Making  Imaging: Lumbar MRI dated 07/22/20:  FINDINGS: Segmentation:  5 lumbar type vertebrae   Alignment:  Grade 1 anterolisthesis at L4-5, facet mediated   Vertebrae: Marrow edema about the right more than left L4-5 facets. No fracture or aggressive bone lesion   Conus medullaris and cauda equina: Conus extends to the L1-2 level. Conus and cauda equina appear normal.   Paraspinal and other soft tissues: Right renal cystic intensities. No perispinal mass or inflammation seen.   Disc levels:   T12- L1: Unremarkable.   L1-L2: Unremarkable.   L2-L3: Mild facet spurring   L3-L4: Disc narrowing and bulging. Moderate to bulky facet spurring on the right more than left.   L4-L5: Advanced facet osteoarthritis with spurring and anterolisthesis. Spurring is bulky and there is ligamentum flavum thickening with degenerative ganglion on the left measuring  5 mm. Interspinous ganglia is also present. Advanced spinal stenosis. Patent foramina   L5-S1:Facet spurring with posterior synovial cyst on the left.   IMPRESSION: Prominent facet osteoarthritis especially at L4-5 where there is anterolisthesis, marrow edema, degenerative ganglia. L4-5 advanced spinal stenosis.     Electronically Signed   By: Marnee Spring M.D.   On: 07/22/2020 19:46   Cervical MRI dated 07/22/20:  FINDINGS: Alignment: Grade 1 anterolisthesis at L4-5, degenerative.   Vertebrae: No fracture, evidence of discitis, or bone lesion. Mild discogenic endplate edema at the C7 superior endplate.   Cord: Normal   Posterior Fossa, vertebral arteries, paraspinal tissues: Negative   Disc levels:   C1-2: Iso- to hypointense pannus appearance extending superiorly from the atlantodental joint with mild erosion and edema seen at the tip of the dens.   C2-3: Degenerative facet spurring bilaterally   C3-4: Degenerative facet spurring on the right more than left with asymmetric right uncovertebral spurring and advanced right foraminal impingement   C4-5: Degenerative facet spurring which is at least moderate. Bulging of the disc with mild right foraminal narrowing   C5-6: Disc narrowing and bulging with uncovertebral and facet spurring asymmetric to the right where there is severe foraminal impingement. Mild spinal stenosis   C6-7: Disc narrowing and bulging. Posterior annular fissure. Negative facets.   C7-T1:Unremarkable.   IMPRESSION: 1. Multilevel disc and facet degeneration with asymmetric right-sided uncovertebral and facet spurring. Right foraminal impingement that is severe at C3-4 and C5-6. 2. Pannus and inflammatory/erosive changes suggested at the atlantodental joint. Is there history of inflammatory arthritis.     Electronically Signed   By: Marnee Spring M.D.   On: 07/22/2020 19:42  I have personally reviewed the images and agree with the  above interpretation.  Lumbar xrays dated 07/28/20:  Slip at L4-L5 with no instability on flexion/extension. Mild DDD L5-S1 and diffuse spondylosis.  Assessment and Plan: Sabrina Barker is a pleasant 72 y.o. female who has 14+ year history of constant LBP with constant bilateral lateral leg pain to her feet that is worse with walking, standing, cleaning. LBP > leg pain. Some relief with sitting. She has numbness and tingling in her legs. She has weakness  in the legs.   Previous imaging from 2022 showed slip at L4-L5 with severe central stenosis. Also with lumbar spondylosis L5-S1 as well.   LBP is likely due to underlying spondylosis. Leg symptoms may be from spinal stenosis.   She has constant neck pain. No arm pain. She has numbness and tingling in her arms. She notes worsening dexterity issues and balance issues.   Previous imaging from 2022 showed diffuse cervical spondylosis with multilevel foraminal stenosis. Mild central stenosis C5-C6.   Her worsening dexterity and balance issues are concerning for cervical myelopathy.   Treatment options discussed with patient and following plan made:   - MRI of cervical spine to further evaluate for spinal stenosis. She has worsening dexterity and balance issues.  - MRI of lumbar spine to further evaluate bilateral lumbar radiculopathy. No improvement with time or medications.  - Continue on norco from her PCP. We will not take over prescribing this.  - Continue on neurontin and prn zanaflex from other providers as well.  - Will schedule follow up visit to review MRI results once I get them back.  - She is not interested in surgery options. Of note, she has contrast allergy. Dr. Mariah Milling was going to premedicate her with benadryl and prednisonen for cervical injection and did not need to use contrast for lumbar injection.   I spent a total of 35 minutes in face-to-face and non-face-to-face activities related to this patient's care today including  review of outside records, review of imaging, review of symptoms, physical exam, discussion of differential diagnosis, discussion of treatment options, and documentation.   Thank you for involving me in the care of this patient.   Drake Leach PA-C Dept. of Neurosurgery

## 2022-11-21 ENCOUNTER — Other Ambulatory Visit: Payer: Self-pay

## 2022-11-21 ENCOUNTER — Inpatient Hospital Stay
Admission: RE | Admit: 2022-11-21 | Discharge: 2022-11-21 | Disposition: A | Discharge status: Home / Self Care | Payer: Self-pay | Source: Ambulatory Visit | Attending: Orthopedic Surgery | Admitting: Orthopedic Surgery

## 2022-11-21 DIAGNOSIS — Z049 Encounter for examination and observation for unspecified reason: Secondary | ICD-10-CM

## 2022-11-23 ENCOUNTER — Encounter: Payer: Self-pay | Admitting: Orthopedic Surgery

## 2022-11-23 ENCOUNTER — Ambulatory Visit (INDEPENDENT_AMBULATORY_CARE_PROVIDER_SITE_OTHER): Payer: 59 | Admitting: Orthopedic Surgery

## 2022-11-23 ENCOUNTER — Other Ambulatory Visit: Payer: Self-pay

## 2022-11-23 VITALS — BP 130/78 | Ht 65.0 in | Wt 164.0 lb

## 2022-11-23 DIAGNOSIS — M48061 Spinal stenosis, lumbar region without neurogenic claudication: Secondary | ICD-10-CM

## 2022-11-23 DIAGNOSIS — M47816 Spondylosis without myelopathy or radiculopathy, lumbar region: Secondary | ICD-10-CM

## 2022-11-23 DIAGNOSIS — M4802 Spinal stenosis, cervical region: Secondary | ICD-10-CM

## 2022-11-23 DIAGNOSIS — M4726 Other spondylosis with radiculopathy, lumbar region: Secondary | ICD-10-CM

## 2022-11-23 DIAGNOSIS — R2689 Other abnormalities of gait and mobility: Secondary | ICD-10-CM

## 2022-11-23 DIAGNOSIS — M47812 Spondylosis without myelopathy or radiculopathy, cervical region: Secondary | ICD-10-CM

## 2022-11-23 DIAGNOSIS — M542 Cervicalgia: Secondary | ICD-10-CM

## 2022-11-23 DIAGNOSIS — M4316 Spondylolisthesis, lumbar region: Secondary | ICD-10-CM

## 2022-11-23 DIAGNOSIS — R278 Other lack of coordination: Secondary | ICD-10-CM

## 2022-11-23 DIAGNOSIS — M5416 Radiculopathy, lumbar region: Secondary | ICD-10-CM

## 2022-11-23 MED ORDER — HYDROCODONE-ACETAMINOPHEN 5-325 MG PO TABS: 1.0000 | ORAL_TABLET | Freq: Two times a day (BID) | ORAL | 0 refills | Status: DC | PRN

## 2022-11-23 NOTE — Patient Instructions (Signed)
It was so nice to see you today. Thank you so much for coming in.    I want to get an MRI of your neck and lower back to look into things further. We will get this approved through your insurance and [facility] [place] [facility] call you to schedule the appointment. Make sure you are scheduled for the WIDE BORE MRI.   [facility] [place] [address] [place], [place] [address]   After you have the MRIs done, it takes 10-14 days for me to get the results back. Once I have them, we will call you to schedule a follow up visit with me to review them.   Please do not hesitate to call if you have any questions or concerns. You can also message me in MyChart.   Drake Leach PA-C [phone]     The physicians and staff at [facility] are committed to providing excellent care. You may receive a survey asking for feedback about your experience at our office. We value you your feedback and appreciate you taking the time to to fill it out. The [facility] leadership team is also available to discuss your experience in person, feel free to contact us [phone].

## 2022-12-01 ENCOUNTER — Ambulatory Visit: Admission: RE | Admit: 2022-12-01 | Payer: 59 | Source: Ambulatory Visit

## 2022-12-01 ENCOUNTER — Ambulatory Visit (INDEPENDENT_AMBULATORY_CARE_PROVIDER_SITE_OTHER): Payer: 59 | Admitting: Family

## 2022-12-01 VITALS — BP 170/90 | HR 84 | Ht 65.0 in | Wt 159.8 lb

## 2022-12-01 DIAGNOSIS — I10 Essential (primary) hypertension: Secondary | ICD-10-CM

## 2022-12-01 DIAGNOSIS — G894 Chronic pain syndrome: Secondary | ICD-10-CM

## 2022-12-01 DIAGNOSIS — E782 Mixed hyperlipidemia: Secondary | ICD-10-CM | POA: Diagnosis not present

## 2022-12-01 DIAGNOSIS — J449 Chronic obstructive pulmonary disease, unspecified: Secondary | ICD-10-CM

## 2022-12-01 NOTE — Progress Notes (Signed)
Acute Office Visit  Subjective:     Patient ID: Sabrina Barker, female    DOB: 1950-03-23, 72 y.o.   MRN: [id number]  Patient is in today for  Chief Complaint  Patient presents with   Follow-up    Discuss meds     Patient is here today for her 3 months follow up.  She has been feeling poorly since last appointment.   She does have additional concerns to discuss today.  Has been hesitant to take her meds as she had some confusion.  Asks if we can review them today.   Labs are not due today. She needs refills.   I have reviewed her active problem list, medication list, allergies, health maintenance, notes from last encounter, lab results for her appointment today.       Review of Systems  All other systems reviewed and are negative.       Objective:    BP (!) 170/90   Pulse 84   Ht 5\' 5"  (1.651 m)   Wt 159 lb 12.8 oz (72.5 kg)   SpO2 98%   BMI 26.59 kg/m   Physical Exam Vitals and nursing note reviewed.  Constitutional:      Appearance: Normal appearance. She is normal weight.  HENT:     Head: Normocephalic.  Eyes:     Extraocular Movements: Extraocular movements intact.     Conjunctiva/sclera: Conjunctivae normal.     Pupils: Pupils are equal, round, and reactive to light.  Cardiovascular:     Rate and Rhythm: Normal rate.  Pulmonary:     Effort: Pulmonary effort is normal.  Neurological:     General: No focal deficit present.     Mental Status: She is alert and oriented to person, place, and time. Mental status is at baseline.  Psychiatric:        Mood and Affect: Mood normal.        Behavior: Behavior normal.        Thought Content: Thought content normal.     No results found for any visits on 12/01/22.  Recent Results (from the past 2160 hours)  Cologuard     Status: None   Collection Time: 01/02/23 11:00 AM  Result Value Ref Range   COLOGUARD Negative Negative    Comment: NEGATIVE TEST RESULT. A negative Cologuard result indicates a  low likelihood that a colorectal cancer (CRC) or advanced adenoma (adenomatous polyps with more advanced pre-malignant features)  is present. The chance that a person with a negative Cologuard test has a colorectal cancer is less than 1 in 1500 (negative predictive value >99.9%) or has an  advanced adenoma is less than  5.3% (negative predictive value 94.7%). These data are based on a prospective cross-sectional study of 10,000 individuals at average risk for colorectal cancer who were screened with both Cologuard and colonoscopy. (Imperiale T. et al, N Engl J Med 2014;370(14):1286-1297) The normal value (reference range) for this assay is negative.  COLOGUARD RE-SCREENING RECOMMENDATION: Periodic colorectal cancer screening is an important part of preventive healthcare for asymptomatic individuals at average risk for colorectal cancer.  Following a negative Cologuard result, the American Cancer Society and U.S.  Multi-Society Task Force screening guidelines recommend a Cologuard re-screening interval of 3 years.  References: American Cancer Society Guideline for Colorectal Cancer Screening: https://www.cancer.org/cancer/colon-rectal-cancer/detection-diagnosis-staging/acs-recommendations.html.; Rex DK, Boland CR, Dominitz JK, Colorectal Cancer Screening: Recommendations for Physicians and Patients from the U.S. Multi-Society Task Force on Colorectal Cancer Screening , Am J Gastroenterology 2017; 112:1016-1030.  TEST DESCRIPTION: Composite algorithmic analysis of stool DNA-biomarkers with hemoglobin immunoassay.   Quantitative values of individual biomarkers are not reportable and are not associated with individual biomarker result reference ranges. Cologuard is intended for colorectal cancer screening of adults of either sex, 45 years or older, who are at average-risk for colorectal cancer (CRC). Cologuard has been approved for use by the U.S. FDA. The performance of Cologuard was  established in a cross  sectional study of average-risk adults aged 13-84. Cologuard performance in patients ages 19 to 31 years was estimated by sub-group analysis of near-age groups. Colonoscopies performed for a positive result may find as the most clinically significant lesion: colorectal cancer [4.0%], advanced adenoma (including sessile serrated polyps greater than or equal to 1cm diameter) [20%] or non- advanced adenoma [31%]; or no colorectal neoplasia [45%]. These estimates are derived from a prospective cross-sectional screening study of 10,000 individuals at average risk for colorectal cancer who were screened with both Cologuard and colonoscopy. (Imperiale T. et al, Macy Mis J Med 2014;370(14):1286-1297.) Cologuard may produce a false negative or false positive result (no colorectal cancer or precancerous polyp present at colonoscopy follow up). A negative Cologuard test result does not guarantee the absence of CRC or advanced adenoma (pre-cancer). The current Cologuard  screening interval is every 3 years. Science writer and [place] Therapist, music). Cologuard performance data in a 10,000 patient pivotal study using colonoscopy as the reference method can be accessed at the following location: www.exactlabs.com/results. Additional description of the Cologuard test process, warnings and precautions can be found at www.cologuard.com.        Assessment & Plan:   Problem List Items Addressed This Visit       Active Problems   Chronic pain syndrome - Primary (Chronic)   Patient stable.  Well controlled with current therapy.   Continue current meds.        Essential hypertension, benign   Reminded patient of the importance of taking her medications as directed. Will reassess BP at follow up in 2 weeks.      COPD (chronic obstructive pulmonary disease) (HCC)   Patient stable.  Well controlled with current therapy.   Continue current meds.        Mixed hyperlipidemia   Continue current  therapy for lipid control. Will modify as needed based on labwork results.          Return in about 2 weeks (around 12/15/2022) for AWV.  Total time spent: 20 minutes  Miki Kins, FNP  12/01/2022   This document may have been prepared by [facility] Voice Recognition software and as such may include unintentional dictation errors.

## 2022-12-06 ENCOUNTER — Ambulatory Visit: Payer: 59 | Admitting: Cardiology

## 2022-12-07 ENCOUNTER — Ambulatory Visit: Payer: 59 | Admitting: Family

## 2022-12-20 ENCOUNTER — Encounter: Payer: Self-pay | Admitting: Family

## 2022-12-20 ENCOUNTER — Ambulatory Visit: Payer: 59 | Admitting: Family

## 2022-12-20 ENCOUNTER — Ambulatory Visit (INDEPENDENT_AMBULATORY_CARE_PROVIDER_SITE_OTHER): Payer: 59 | Admitting: Family

## 2022-12-20 VITALS — BP 140/80 | HR 101 | Ht 65.0 in | Wt 162.6 lb

## 2022-12-20 DIAGNOSIS — Z1211 Encounter for screening for malignant neoplasm of colon: Secondary | ICD-10-CM

## 2022-12-20 DIAGNOSIS — Z23 Encounter for immunization: Secondary | ICD-10-CM | POA: Diagnosis not present

## 2022-12-20 DIAGNOSIS — Z13820 Encounter for screening for osteoporosis: Secondary | ICD-10-CM

## 2022-12-20 DIAGNOSIS — J449 Chronic obstructive pulmonary disease, unspecified: Secondary | ICD-10-CM

## 2022-12-20 DIAGNOSIS — Z0000 Encounter for general adult medical examination without abnormal findings: Secondary | ICD-10-CM

## 2022-12-20 DIAGNOSIS — Z1231 Encounter for screening mammogram for malignant neoplasm of breast: Secondary | ICD-10-CM

## 2022-12-20 MED ORDER — GABAPENTIN 400 MG PO CAPS: 400.0000 mg | ORAL_CAPSULE | Freq: Every day | ORAL | 1 refills | Status: DC

## 2022-12-20 MED ORDER — HYDROCODONE-ACETAMINOPHEN 5-325 MG PO TABS: 1.0000 | ORAL_TABLET | Freq: Two times a day (BID) | ORAL | 0 refills | Status: DC | PRN

## 2022-12-21 NOTE — Progress Notes (Signed)
Annual Wellness Visit  Patient: Sabrina Barker, Female    DOB: 23-Mar-1950, 72 y.o.   MRN: [id number] Visit Date: 12/21/2022  Today's Provider: Miki Kins, FNP  Subjective:    Chief Complaint  Patient presents with   Annual Exam    AWV   Sabrina Barker is a 72 y.o. female who presents today for her Annual Wellness Visit.  Patient is here today for her medicare AWV.  She is doing well in general.   Needs refills  No other concerns today.    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   Depression    GERD (gastroesophageal reflux disease)    Heart murmur    Hyperlipidemia    Hypertension    Hypokalemia 01/30/2022   Hypomagnesemia 01/30/2022   Past Surgical History:  Procedure Laterality Date   ABDOMINAL HYSTERECTOMY     APPENDECTOMY     CORONARY STENT INTERVENTION N/A 03/20/2020   Procedure: CORONARY STENT INTERVENTION;  Surgeon: Yvonne Kendall, MD;  Location: [facility] INVASIVE CV [facility];  Service: Cardiovascular;  Laterality: N/A;   FACIAL COSMETIC SURGERY  2016   LAPAROSCOPY ABDOMEN DIAGNOSTIC  2016   removal of adhesions   LEFT HEART CATH AND CORONARY ANGIOGRAPHY N/A 03/20/2020   Procedure: LEFT HEART CATH AND CORONARY ANGIOGRAPHY with Intervention;  Surgeon: Laurier Nancy, MD;  Location: [facility] INVASIVE CV LAB;  Service: Cardiovascular;  Laterality: N/A;   rhinoplasty  2016   tummy tuck  2016   VASCULAR SURGERY     Family History  Problem Relation Age of Onset   Heart disease Mother    Stroke Mother    Cancer Mother    Heart disease Father    Heart disease Brother    Heart disease Brother    Social History   Socioeconomic History   Marital status: Widowed    Spouse name: Not on file   Number of children: Not on file   Years of education: Not on file   Highest education level: Not on file  Occupational History    Not on file  Tobacco Use   Smoking status: Every Day    Current packs/day: 0.15    Types: Cigarettes   Smokeless tobacco: Never   Tobacco comments:    3-4 cig/day  Substance and Sexual Activity   Alcohol use: No   Drug use: Never   Sexual activity: Not on file  Other Topics Concern   Not on file  Social History Narrative   ** Merged History Encounter **       Social Determinants of Health   Financial Resource Strain: Not on file  Food Insecurity: No Food Insecurity (01/27/2022)   Hunger Vital Sign    Worried About Running Out of Food in the Last Year: Never true    Ran Out of Food in the Last Year: Never true  Transportation Needs: No Transportation Needs (01/27/2022)   PRAPARE - Administrator, Civil Service (Medical): No    Lack of Transportation (Non-Medical): No  Physical Activity: Not on file  Stress: Not on file  Social Connections: Not on file  Intimate Partner Violence: Not At Risk (01/27/2022)   Humiliation, Afraid, Rape, and Kick questionnaire    Fear of Current or Ex-Partner: No    Emotionally Abused: No    Physically Abused:  No    Sexually Abused: No    Medications: Outpatient Medications Prior to Visit  Medication Sig   aspirin EC 81 MG tablet Take 81 mg by mouth daily. Swallow whole.   atorvastatin (LIPITOR) 80 MG tablet Take 1 tablet (80 mg total) by mouth daily.   Biotin 5000 MCG CAPS Take by mouth.   buPROPion (WELLBUTRIN SR) 150 MG 12 hr tablet Take 150 mg by mouth daily.   diphenhydrAMINE (BENADRYL) 50 MG tablet Take 1 tab night before and 1 tab 1 hour prior to cta, and 1 tab 4 hours after cta   Folic Acid (FOLATE PO) Take by mouth.   Garlic 1000 MG CAPS Take by mouth daily.   hydrOXYzine (ATARAX) 10 MG tablet Take 10 mg by mouth 3 (three) times daily as needed.   Iron Combinations (CHROMAGEN) capsule Take 1 capsule by mouth daily.   losartan-hydrochlorothiazide (HYZAAR) 100-25 MG tablet Take 1 tablet by mouth daily.   Melatonin 10 MG  TABS Take by mouth.   Omega-3 Fatty Acids (FISH OIL) 300 MG CAPS Take by mouth.   pantoprazole (PROTONIX) 40 MG tablet Take 1 tablet (40 mg total) by mouth daily.   ticagrelor (BRILINTA) 90 MG TABS tablet Take 1 tablet (90 mg total) by mouth 2 (two) times daily.   tiZANidine (ZANAFLEX) 4 MG tablet TAKE 1 TABLET BY MOUTH FOUR TIMES A DAY   traZODone (DESYREL) 100 MG tablet Take 100 mg by mouth at bedtime.   VENTOLIN HFA 108 (90 Base) MCG/ACT inhaler Inhale 2 puffs into the lungs every 4 (four) hours as needed for wheezing or shortness of breath.   Zinc 50 MG TABS Take by mouth.   [DISCONTINUED] gabapentin (NEURONTIN) 400 MG capsule Take 1 capsule (400 mg total) by mouth at bedtime.   [DISCONTINUED] HYDROcodone-acetaminophen (NORCO/VICODIN) 5-325 MG tablet Take 1 tablet by mouth 2 (two) times daily as needed for severe pain (pain score 7-10).   furosemide (LASIX) 20 MG tablet TAKE 1 TABLET BY MOUTH EVERY DAY (Patient not taking: Reported on 12/20/2022)   [DISCONTINUED] predniSONE (DELTASONE) 50 MG tablet Take 1 tab night before and 1 tab day after cta aorta   No facility-administered medications prior to visit.    Allergies  Allergen Reactions   Iodinated Contrast Media Anaphylaxis    Swelling and can't breath    Penicillins Shortness Of Breath and Swelling    Patient states "I pass out"  EMS is normally called.    Penicillins Anaphylaxis, Shortness Of Breath and Swelling    Patient states "I pass out"  EMS is normally called.   Shellfish Allergy Anaphylaxis    Iodine fine topically.  Can not use ivp dye    Patient Care Team: Miki Kins, FNP as PCP - General (Family Medicine) Corky Downs, MD (Internal Medicine)  Review of Systems  All other systems reviewed and are negative.      Objective:    Vitals: BP (!) 140/80   Pulse (!) 101   Ht 5\' 5"  (1.651 m)   Wt 162 lb 9.6 oz (73.8 kg)   SpO2 97%   BMI 27.06 kg/m    Physical Exam Vitals and nursing note reviewed.   Constitutional:      Appearance: Normal appearance. She is normal weight.  HENT:     Head: Normocephalic.  Eyes:     Extraocular Movements: Extraocular movements intact.     Conjunctiva/sclera: Conjunctivae normal.     Pupils: Pupils are equal, round, and reactive  to light.  Cardiovascular:     Rate and Rhythm: Normal rate.  Pulmonary:     Effort: Pulmonary effort is normal.  Neurological:     General: No focal deficit present.     Mental Status: She is alert and oriented to person, place, and time. Mental status is at baseline.  Psychiatric:        Mood and Affect: Mood normal.        Behavior: Behavior normal.        Thought Content: Thought content normal.        Judgment: Judgment normal.      Most recent functional status assessment:    01/27/2022    9:27 AM  In your present state of health, do you have any difficulty performing the following activities:  Hearing? 0  Vision? 0  Difficulty concentrating or making decisions? 0  Walking or climbing stairs? 0  Dressing or bathing? 0  Doing errands, shopping? 0    Most recent fall risk assessment:    11/17/2016    8:21 AM  Fall Risk   Falls in the past year? No     Most recent depression screenings:    11/17/2016    8:21 AM 11/07/2016   11:37 AM  PHQ 2/9 Scores  PHQ - 2 Score 0 0    Most recent cognitive screening:    12/20/2022    1:30 PM  6CIT Screen  What Year? 0 points  What month? 0 points  What time? 0 points  Count back from 20 0 points  Months in reverse 2 points  Repeat phrase 2 points  Total Score 4 points    No results found for any visits on 12/20/22.     Assessment & Plan:      Annual wellness visit done today including the all of the following: Reviewed patient's Family Medical History Reviewed and updated list of patient's medical providers Assessment of cognitive impairment was done Assessed patient's functional ability Established a written schedule for health screening  services Health Risk Assessent Completed and Reviewed  Exercise Activities and Dietary recommendations  Goals      Anxiety Symptoms Monitored and Managed     Evidence-based guidance:  Discuss adherence to medication therapy; assess and manage barriers, such as costs, side effects, feeling little or no improvement, stigma or low motivation.  Discuss past experiences with psychotropic medication, potential side effects and need to continue medication until treatment becomes effective (e.g., 4 to 8 weeks).  Explain the interplay of stress and physical symptoms, as well as the relationship between illness and emotional problems.  Explore complementary and alternative therapy options, such as applied relaxation, mindfulness, meditation, music therapy, aromatherapy, acupuncture or massage.  Prepare patient for antidepressant pharmacologic therapy which may include additional short-term medications until maintenance medications demonstrate therapeutic effect.   Assess response; monitor and manage side effects.  Prepare patient for use of pharmacologic therapy (e.g., anxiolytic, selective serotonin-reuptake inhibitor, serotonin norepinephrine-reuptake inhibitor, tricyclic antidepressant, antiepileptic, antipsychotic for comorbid depression,   phosphodiesterase-inhibitor for sexual dysfunction, sedative or hypnotic for sleep disturbance); monitor and manage side effects.  Prepare patient for long-term pharmacologic therapy to prevent relapse (e.g., 12 months after symptom improvement).  Promote cognitive behavioral therapy, individualized to severity of symptoms [e.g., nonfacilitated self-help, facilitated (computerized or manual-based) self-help, face-to-face individual treatment].  Encourage patient to develop a self-management plan that identifies and manages lifestyle triggers (e.g., caffeine, stimulants, nicotine, stress), improves sleep, exercise or physical activity based on tolerance.  Prepare  patient for a referral to a psychiatrist when patient has a poor response to treatment, atypical presentation or comorbid psychiatric disorder.  Provide frequent follow-up (e.g., every 2 weeks for the first 6 to 8 weeks of treatment and monthly thereafter).  Explore means to support work reintegration, such as modified working hours, peer support or coaching or a community jobs program.   Notes:      Tobacco Use Managed     Evidence-based guidance:  Identify tobacco use status including cigarette, cigar, roll-your-own, dissolvable, hookah or smokeless tobacco, nicotine gels, vapes, e-cigarettes or other electronic delivery systems.  Identify the patient's willingness to quit or cut down.   Intervene using the ?o5A's? technique:  Ask: Identify and document tobacco use status for every patient at every visit.  Assess willingness to make a quit attempt now.  Advise: In a clear, strong and personalized manner, urge every tobacco user to quit.  Assist: For the patient willing to make a quit attempt, use counseling and pharmacologic therapy (nicotine replacement, antidepressant, nicotinic receptor partial agonist) to help quit.  Arrange: Schedule follow-up contact, in person or by telephone, preferably within the first week after the quit date.   Notes:      Weight (lb) < 150 lb (68 kg)        Immunization History  Administered Date(s) Administered   Fluad Trivalent(High Dose 65+) 12/20/2022    Health Maintenance  Topic Date Due   COVID-19 Vaccine (1) Never done   Pneumonia Vaccine 50+ Years old (1 of 2 - PCV) Never done   Hepatitis C Screening  Never done   DTaP/Tdap/Td (1 - Tdap) Never done   Zoster Vaccines- Shingrix (1 of 2) Never done   Fecal DNA (Cologuard)  Never done   Lung Cancer Screening  Never done   MAMMOGRAM  Never done   DEXA SCAN  Never done   Medicare Annual Wellness (AWV)  12/20/2023   INFLUENZA VACCINE  Completed   HPV VACCINES  Aged Out     Discussed  health benefits of physical activity, and encouraged her to engage in regular exercise appropriate for her age and condition.      Miki Kins, FNP   12/20/2022  This document may have been prepared by Dragon Voice Recognition software and as such may include unintentional dictation errors.

## 2022-12-24 ENCOUNTER — Encounter: Payer: Self-pay | Admitting: Family

## 2022-12-24 NOTE — Progress Notes (Signed)
Established Patient Office Visit  Subjective:  Patient ID: Sabrina Barker, female    DOB: 1950/03/09  Age: 72 y.o. MRN: [id number]  Chief Complaint  Patient presents with   Follow-up    4 mo    Patient is here today for her 4 months follow up.  She has been feeling fairly well since last appointment.   She does have additional concerns to discuss today.  She has been having a lot of pain in her back, asks if there is anything else we can do to help with this. She has been having some falls as a result of the back pain and weakness.   Labs are due today. She needs refills.   I have reviewed her active problem list, medication list, allergies, notes from last encounter, lab results for her appointment today.      No other concerns at this time.   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   Depression    GERD (gastroesophageal reflux disease)    Heart murmur    Hyperlipidemia    Hypertension    Hypokalemia 01/30/2022   Hypomagnesemia 01/30/2022    Past Surgical History:  Procedure Laterality Date   ABDOMINAL HYSTERECTOMY     APPENDECTOMY     CORONARY STENT INTERVENTION N/A 03/20/2020   Procedure: CORONARY STENT INTERVENTION;  Surgeon: Yvonne Kendall, MD;  Location: [facility] INVASIVE CV [facility];  Service: Cardiovascular;  Laterality: N/A;   FACIAL COSMETIC SURGERY  2016   LAPAROSCOPY ABDOMEN DIAGNOSTIC  2016   removal of adhesions   LEFT HEART CATH AND CORONARY ANGIOGRAPHY N/A 03/20/2020   Procedure: LEFT HEART CATH AND CORONARY ANGIOGRAPHY with Intervention;  Surgeon: Laurier Nancy, MD;  Location: [facility] INV[facility] CV LAB;  Service: Cardiovascular;  Laterality: N/A;   rhinoplasty  2016   tummy tuck  2016   VASCULAR SURGERY      Social History   Socioeconomic History   Marital status: Widowed    Spouse name: Not on file   Number  of children: Not on file   Years of education: Not on file   Highest education level: Not on file  Occupational History   Not on file  Tobacco Use   Smoking status: Every Day    Current packs/day: 0.15    Types: Cigarettes   Smokeless tobacco: Never   Tobacco comments:    3-4 cig/day  Substance and Sexual Activity   Alcohol use: No   Drug use: Never   Sexual activity: Not on file  Other Topics Concern   Not on file  Social History Narrative   ** Merged History Encounter **       Social Determinants of Health   Financial Resource Strain: Not on file  Food Insecurity: No Food Insecurity (01/27/2022)   Hunger Vital Sign    Worried About Running Out of Food in the Last Year: Never true    Ran Out of Food in the Last Year: Never true  Transportation Needs: No Transportation Needs (01/27/2022)   PRAPARE - Administrator, Civil Service (Medical): No    Lack of Transportation (Non-Medical): No  Physical Activity: Not on file  Stress: Not on file  Social Connections: Not on file  Intimate Partner Violence: Not At Risk (01/27/2022)   Humiliation, Afraid, Rape, and Kick questionnaire  Fear of Current or Ex-Partner: No    Emotionally Abused: No    Physically Abused: No    Sexually Abused: No    Family History  Problem Relation Age of Onset   Heart disease Mother    Stroke Mother    Cancer Mother    Heart disease Father    Heart disease Brother    Heart disease Brother     Allergies  Allergen Reactions   Iodinated Contrast Media Anaphylaxis    Swelling and can't breath    Penicillins Shortness Of Breath and Swelling    Patient states "I pass out"  EMS is normally called.    Penicillins Anaphylaxis, Shortness Of Breath and Swelling    Patient states "I pass out"  EMS is normally called.   Shellfish Allergy Anaphylaxis    Iodine fine topically.  Can not use ivp dye    Review of Systems  All other systems reviewed and are negative.      Objective:    BP (!) 140/65   Pulse 73   Ht 5\' 5"  (1.651 m)   Wt 162 lb (73.5 kg)   SpO2 97%   BMI 26.96 kg/m   Vitals:   11/14/22 1000  BP: (!) 140/65  Pulse: 73  Height: 5\' 5"  (1.651 m)  Weight: 162 lb (73.5 kg)  SpO2: 97%  BMI (Calculated): 26.96    Physical Exam Vitals and nursing note reviewed.  Constitutional:      Appearance: Normal appearance. She is normal weight.  HENT:     Head: Normocephalic.  Eyes:     Extraocular Movements: Extraocular movements intact.     Conjunctiva/sclera: Conjunctivae normal.     Pupils: Pupils are equal, round, and reactive to light.  Cardiovascular:     Rate and Rhythm: Normal rate.  Pulmonary:     Effort: Pulmonary effort is normal.  Neurological:     General: No focal deficit present.     Mental Status: She is alert and oriented to person, place, and time. Mental status is at baseline.  Psychiatric:        Mood and Affect: Mood normal.        Behavior: Behavior normal.        Thought Content: Thought content normal.        Judgment: Judgment normal.      No results found for any visits on 11/14/22.  No results found for this or any previous visit (from the past 2160 hour(s)).     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   Return in about 1 month (around 12/15/2022) for AWV.   Total time spent: 20 minutes  Miki Kins, FNP  11/14/2022   This document may have been prepared by [facility] Voice Recognition software and as such may include unintentional dictation  errors.

## 2022-12-27 ENCOUNTER — Ambulatory Visit (INDEPENDENT_AMBULATORY_CARE_PROVIDER_SITE_OTHER): Payer: 59

## 2022-12-27 DIAGNOSIS — M8589 Other specified disorders of bone density and structure, multiple sites: Secondary | ICD-10-CM

## 2022-12-27 DIAGNOSIS — Z13820 Encounter for screening for osteoporosis: Secondary | ICD-10-CM | POA: Diagnosis not present

## 2023-01-01 ENCOUNTER — Other Ambulatory Visit: Payer: Self-pay | Admitting: Cardiology

## 2023-01-03 ENCOUNTER — Other Ambulatory Visit: Payer: 59

## 2023-01-10 LAB — COLOGUARD: COLOGUARD: NEGATIVE

## 2023-01-22 ENCOUNTER — Encounter: Payer: Self-pay | Admitting: Family

## 2023-01-22 NOTE — Assessment & Plan Note (Signed)
Patient stable.  Well controlled with current therapy.   Continue current meds.  

## 2023-01-22 NOTE — Assessment & Plan Note (Signed)
Reminded patient of the importance of taking her medications as directed. Will reassess BP at follow up in 2 weeks.

## 2023-01-22 NOTE — Assessment & Plan Note (Signed)
Continue current therapy for lipid control. Will modify as needed based on labwork results.   

## 2023-02-14 ENCOUNTER — Ambulatory Visit: Payer: 59 | Admitting: Family

## 2023-02-23 ENCOUNTER — Other Ambulatory Visit: Payer: Self-pay

## 2023-02-23 ENCOUNTER — Telehealth: Payer: Self-pay

## 2023-02-23 NOTE — Telephone Encounter (Signed)
Rx refill request sent to [facility]

## 2023-02-23 NOTE — Telephone Encounter (Signed)
Pt called and lvm requesting a RX for pain medication to be called in to her Pharmacy, please advise-HQ

## 2023-02-24 MED ORDER — HYDROCODONE-ACETAMINOPHEN 5-325 MG PO TABS: 1.0000 | ORAL_TABLET | Freq: Two times a day (BID) | ORAL | 0 refills | Status: DC | PRN

## 2023-03-19 ENCOUNTER — Other Ambulatory Visit: Payer: Self-pay | Admitting: Family

## 2023-03-22 ENCOUNTER — Ambulatory Visit: Payer: 59 | Admitting: Family

## 2023-03-29 ENCOUNTER — Ambulatory Visit: Payer: 59 | Admitting: Family

## 2023-04-10 ENCOUNTER — Encounter: Payer: Self-pay | Admitting: Family

## 2023-04-10 ENCOUNTER — Ambulatory Visit (INDEPENDENT_AMBULATORY_CARE_PROVIDER_SITE_OTHER): Payer: 59 | Admitting: Family

## 2023-04-10 VITALS — BP 142/82 | HR 67 | Ht 65.0 in | Wt 169.0 lb

## 2023-04-10 DIAGNOSIS — I10 Essential (primary) hypertension: Secondary | ICD-10-CM

## 2023-04-10 DIAGNOSIS — M5441 Lumbago with sciatica, right side: Secondary | ICD-10-CM | POA: Diagnosis not present

## 2023-04-10 DIAGNOSIS — M5442 Lumbago with sciatica, left side: Secondary | ICD-10-CM

## 2023-04-10 DIAGNOSIS — G8929 Other chronic pain: Secondary | ICD-10-CM

## 2023-04-10 DIAGNOSIS — E782 Mixed hyperlipidemia: Secondary | ICD-10-CM | POA: Diagnosis not present

## 2023-04-10 DIAGNOSIS — J449 Chronic obstructive pulmonary disease, unspecified: Secondary | ICD-10-CM | POA: Diagnosis not present

## 2023-04-10 DIAGNOSIS — M546 Pain in thoracic spine: Secondary | ICD-10-CM | POA: Diagnosis not present

## 2023-04-10 NOTE — Assessment & Plan Note (Signed)
 Continue current therapy for lipid control. Will modify as needed based on labwork results.

## 2023-04-10 NOTE — Patient Instructions (Addendum)
 [phone] - phone number for radiology scheduling at [facility].

## 2023-04-10 NOTE — Assessment & Plan Note (Signed)
 Blood pressure well controlled with current medications.  Continue current therapy.  Will reassess at follow up.

## 2023-04-10 NOTE — Progress Notes (Signed)
 Established Patient Office Visit  Subjective:  Patient ID: [place], female    DOB: 1950/07/05  Age: 73 y.o. MRN: [id number]  Chief Complaint  Patient presents with   Follow-up    3 month follow up    Patient is here today for her 4 months follow up.  She has been feeling fairly well since last appointment.   She does not have additional concerns to discuss today.  Patient asks if we can get her a phone number for the location of the MRI she was supposed to be scheduling, so she can call back and get it set up.   Labs are due today. She needs refills.   I have reviewed her active problem list, medication list, allergies, notes from last encounter, lab results for her appointment today.      No other concerns at this time.   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   Depression    GERD (gastroesophageal reflux disease)    Heart murmur    Hyperlipidemia    Hypertension    Hypokalemia 01/30/2022   Hypomagnesemia 01/30/2022   Norovirus 01/30/2022   NSTEMI (non-ST elevated myocardial infarction) (HCC) 03/20/2020   Pharmacologic therapy 11/06/2016   Problems influencing health status 11/06/2016    Past Surgical History:  Procedure Laterality Date   ABDOMINAL HYSTERECTOMY     APPENDECTOMY     CORONARY STENT INTERVENTION N/A 03/20/2020   Procedure: CORONARY STENT INTERVENTION;  Surgeon: Yvonne Kendall, MD;  Location: [facility] INVASIVE CV LAB;  Service: Cardiovascular;  Laterality: N/A;   FACIAL COSMETIC SURGERY  2016   LAPAROSCOPY ABDOMEN DIAGNOSTIC  2016   removal of adhesions   LEFT HEART CATH AND CORONARY ANGIOGRAPHY N/A 03/20/2020   Procedure: LEFT HEART CATH AND CORONARY ANGIOGRAPHY with Intervention;  Surgeon: Laurier Nancy, MD;  Location: [facility] INVASIVE CV LAB;  Service: Cardiovascular;   Laterality: N/A;   rhinoplasty  2016   tummy tuck  2016   VASCULAR SURGERY      Social History   Socioeconomic History   Marital status: Widowed    Spouse name: Not on file   Number of children: Not on file   Years of education: Not on file   Highest education level: Not on file  Occupational History   Not on file  Tobacco Use   Smoking status: Every Day    Current packs/day: 0.15    Types: Cigarettes   Smokeless tobacco: Never   Tobacco comments:    3-4 cig/day  Substance and Sexual Activity   Alcohol use: No   Drug use: Never   Sexual activity: Not Currently  Other Topics Concern   Not on file  Social History Narrative   ** Merged History Encounter **       Social Drivers of Health   Financial Resource Strain: Not on file  Food Insecurity: No Food Insecurity (01/27/2022)   Hunger Vital Sign    Worried About Running Out of Food in the Last Year: Never true    Ran Out of Food in the Last Year: Never true  Transportation Needs: No Transportation Needs (01/27/2022)   PRAPARE - Administrator, Civil Service (Medical): No    Lack of Transportation (Non-Medical): No  Physical Activity: Not on file  Stress: Not  on file  Social Connections: Not on file  Intimate Partner Violence: Not At Risk (01/27/2022)   Humiliation, Afraid, Rape, and Kick questionnaire    Fear of Current or Ex-Partner: No    Emotionally Abused: No    Physically Abused: No    Sexually Abused: No    Family History  Problem Relation Age of Onset   Heart disease Mother    Stroke Mother    Cancer Mother    Heart disease Father    Heart disease Brother    Heart disease Brother     Allergies  Allergen Reactions   Iodinated Contrast Media Anaphylaxis    Swelling and can't breath    Penicillins Shortness Of Breath and Swelling    Patient states "I pass out"  EMS is normally called.    Penicillins Anaphylaxis, Shortness Of Breath and Swelling    Patient states "I pass out"  EMS  is normally called.   Shellfish Allergy Anaphylaxis    Iodine fine topically.  Can not use ivp dye    Review of Systems  All other systems reviewed and are negative.      Objective:   BP (!) 142/82   Pulse 67   Ht 5\' 5"  (1.651 m)   Wt 169 lb (76.7 kg)   SpO2 97%   BMI 28.12 kg/m   Vitals:   04/10/23 1113  BP: (!) 142/82  Pulse: 67  Height: 5\' 5"  (1.651 m)  Weight: 169 lb (76.7 kg)  SpO2: 97%  BMI (Calculated): 28.12    Physical Exam Vitals and nursing note reviewed.  Constitutional:      Appearance: Normal appearance. She is normal weight.  HENT:     Head: Normocephalic.  Eyes:     Extraocular Movements: Extraocular movements intact.     Conjunctiva/sclera: Conjunctivae normal.     Pupils: Pupils are equal, round, and reactive to light.  Cardiovascular:     Rate and Rhythm: Normal rate.  Pulmonary:     Effort: Pulmonary effort is normal.  Neurological:     General: No focal deficit present.     Mental Status: She is alert and oriented to person, place, and time. Mental status is at baseline.  Psychiatric:        Mood and Affect: Mood normal.        Behavior: Behavior normal.        Thought Content: Thought content normal.      No results found for any visits on 04/10/23.  No results found for this or any previous visit (from the past 2160 hours).     Assessment & Plan:   Problem List Items Addressed This Visit       Cardiovascular and Mediastinum   Essential hypertension, benign - Primary   Blood pressure well controlled with current medications.  Continue current therapy.  Will reassess at follow up.          Respiratory   COPD (chronic obstructive pulmonary disease) (HCC)     Nervous and Auditory   Chronic low back pain (Tertiary Area of Pain) (Bilateral) (R>L) (Chronic)     Other   Chronic thoracic back pain (Fourth Area of Pain) (Bilateral) (R>L) (Chronic)   Mixed hyperlipidemia   Continue current therapy for lipid control. Will  modify as needed based on labwork results.        Return in about 3 months (around 07/11/2023) for F/U.   Total time spent: 20 minutes  Ilah Boule Daine Gravel, FNP  04/10/2023   This document may have been prepared by [employer] Voice Recognition software and as such may include unintentional dictation errors.

## 2023-04-14 ENCOUNTER — Telehealth: Payer: Self-pay | Admitting: Family

## 2023-04-14 ENCOUNTER — Other Ambulatory Visit: Payer: Self-pay | Admitting: Family

## 2023-04-14 MED ORDER — HYDROCODONE-ACETAMINOPHEN 5-325 MG PO TABS: 1.0000 | ORAL_TABLET | Freq: Two times a day (BID) | ORAL | 0 refills | Status: DC | PRN

## 2023-04-14 NOTE — Telephone Encounter (Signed)
 Patient left [facility] [place] [facility] not [facility]. I don't see any that were sent recently. Do you remember what you were supposed to send in?

## 2023-04-21 ENCOUNTER — Other Ambulatory Visit: Payer: Self-pay

## 2023-04-21 MED ORDER — HYDROCODONE-ACETAMINOPHEN 5-325 MG PO TABS: 1.0000 | ORAL_TABLET | Freq: Two times a day (BID) | ORAL | 0 refills | Status: DC | PRN

## 2023-04-21 NOTE — Telephone Encounter (Signed)
 Rx refill for pain meds was sent to [facility]

## 2023-04-27 ENCOUNTER — Other Ambulatory Visit: Payer: Self-pay | Admitting: Cardiology

## 2023-04-27 DIAGNOSIS — E782 Mixed hyperlipidemia: Secondary | ICD-10-CM

## 2023-06-01 ENCOUNTER — Encounter

## 2023-06-28 ENCOUNTER — Other Ambulatory Visit: Payer: Self-pay | Admitting: Family

## 2023-06-28 ENCOUNTER — Other Ambulatory Visit: Payer: Self-pay | Admitting: Cardiovascular Disease

## 2023-06-28 ENCOUNTER — Other Ambulatory Visit: Payer: Self-pay | Admitting: Cardiology

## 2023-06-28 DIAGNOSIS — I214 Non-ST elevation (NSTEMI) myocardial infarction: Secondary | ICD-10-CM

## 2023-06-28 DIAGNOSIS — I10 Essential (primary) hypertension: Secondary | ICD-10-CM

## 2023-06-28 DIAGNOSIS — E782 Mixed hyperlipidemia: Secondary | ICD-10-CM

## 2023-06-28 DIAGNOSIS — Z720 Tobacco use: Secondary | ICD-10-CM

## 2023-06-28 DIAGNOSIS — I2510 Atherosclerotic heart disease of native coronary artery without angina pectoris: Secondary | ICD-10-CM

## 2023-06-28 DIAGNOSIS — I739 Peripheral vascular disease, unspecified: Secondary | ICD-10-CM

## 2023-07-03 ENCOUNTER — Encounter: Payer: Self-pay | Admitting: Family

## 2023-07-03 ENCOUNTER — Ambulatory Visit: Admitting: Family

## 2023-07-03 VITALS — BP 150/80 | HR 92 | Ht 65.0 in | Wt 173.4 lb

## 2023-07-03 DIAGNOSIS — S61052A Open bite of left thumb without damage to nail, initial encounter: Secondary | ICD-10-CM | POA: Diagnosis not present

## 2023-07-03 DIAGNOSIS — I10 Essential (primary) hypertension: Secondary | ICD-10-CM

## 2023-07-03 DIAGNOSIS — L03012 Cellulitis of left finger: Secondary | ICD-10-CM | POA: Diagnosis not present

## 2023-07-03 DIAGNOSIS — W540XXA Bitten by dog, initial encounter: Secondary | ICD-10-CM

## 2023-07-03 MED ORDER — CIPROFLOXACIN HCL 500 MG PO TABS: 500.0000 mg | ORAL_TABLET | Freq: Two times a day (BID) | ORAL | 0 refills | Status: DC

## 2023-07-03 MED ORDER — CLINDAMYCIN HCL 300 MG PO CAPS: 300.0000 mg | ORAL_CAPSULE | Freq: Three times a day (TID) | ORAL | 0 refills | Status: AC

## 2023-07-03 NOTE — Progress Notes (Signed)
 Acute Office Visit  Subjective:     Patient ID: [place], female    DOB: January 24, 1951, 73 y.o.   MRN: [id number]  Patient is in today for  Chief Complaint  Patient presents with   Acute Visit    Dog bite, possibly infected    Animal Bite  The incident occurred more than 2 days ago (occurred on Saturday, 07/01/2023). The incident occurred at home. There is an injury to the Left thumb. The pain is mild. It is unlikely that a foreign body is present. Pertinent negatives include no chest pain, no fussiness, no numbness, no visual disturbance, no abdominal pain, no bowel incontinence, no nausea, no vomiting, no bladder incontinence, no headaches, no hearing loss, no inability to bear weight, no neck pain, no pain when bearing weight, no focal weakness, no decreased responsiveness, no light-headedness, no loss of consciousness, no seizures, no tingling, no weakness, no cough, no difficulty breathing and no memory loss. There have been no prior injuries to these areas. She is Right-handed. Her tetanus status is UTD. She has been Behaving normally. There were no sick contacts. She has received no recent medical care. Services received include medications given.     Review of Systems  Constitutional:  Negative for decreased responsiveness.  HENT:  Negative for hearing loss.   Eyes:  Negative for visual disturbance.  Respiratory:  Negative for cough.   Cardiovascular:  Negative for chest pain.  Gastrointestinal:  Negative for abdominal pain, bowel incontinence, nausea and vomiting.  Genitourinary:  Negative for bladder incontinence.  Musculoskeletal:  Negative for neck pain.  Neurological:  Negative for tingling, focal weakness, seizures, loss of consciousness, weakness, light-headedness, numbness and headaches.  Psychiatric/Behavioral:  Negative for memory loss.   All other systems reviewed and are negative.       Objective:     BP (!) 150/80   Pulse 92   Ht 5\' 5"  (1.651 m)   Wt  173 lb 6.4 oz (78.7 kg)   SpO2 96%   BMI 28.86 kg/m   Physical Exam Vitals and nursing note reviewed.  Constitutional:      Appearance: Normal appearance. She is normal weight.  HENT:     Head: Normocephalic.  Eyes:     Extraocular Movements: Extraocular movements intact.     Conjunctiva/sclera: Conjunctivae normal.     Pupils: Pupils are equal, round, and reactive to light.  Cardiovascular:     Rate and Rhythm: Normal rate.  Pulmonary:     Effort: Pulmonary effort is normal.  Musculoskeletal:        General: Normal range of motion.     Cervical back: Normal range of motion.  Neurological:     General: No focal deficit present.     Mental Status: She is alert and oriented to person, place, and time. Mental status is at baseline.  Psychiatric:        Mood and Affect: Mood normal.        Behavior: Behavior normal.        Thought Content: Thought content normal.        Judgment: Judgment normal.     No results found for any visits on 07/03/23.  No results found for this or any previous visit (from the past 2160 hours).  Allergies as of 07/03/2023       Reactions   Iodinated Contrast Media Anaphylaxis   Swelling and can't breath   Penicillins Shortness Of Breath, Swelling   Patient states "I pass  out"  EMS is normally called.    Penicillins Anaphylaxis, Shortness Of Breath, Swelling   Patient states "I pass out"  EMS is normally called.   Shellfish Allergy Anaphylaxis   Iodine fine topically.  Can not use ivp dye        Medication List        Accurate as of Jul 03, 2023 11:56 AM. If you have any questions, ask your nurse or doctor.          aspirin  EC 81 MG tablet Take 81 mg by mouth daily. Swallow whole.   atorvastatin  80 MG tablet Commonly known as: LIPITOR  TAKE 1 TABLET BY MOUTH EVERY DAY   Biotin 5000 MCG Caps Take by mouth.   buPROPion  150 MG 12 hr tablet Commonly known as: WELLBUTRIN  SR Take 150 mg by mouth daily.   chromagen capsule Take  1 capsule by mouth daily.   ciprofloxacin 500 MG tablet Commonly known as: CIPRO Take 1 tablet (500 mg total) by mouth 2 (two) times daily. Started by: Kenric Ginger M Devanny Palecek   clindamycin 300 MG capsule Commonly known as: CLEOCIN Take 1 capsule (300 mg total) by mouth 3 (three) times daily for 7 days. Started by: Trenda Frisk   diphenhydrAMINE  50 MG tablet Commonly known as: BENADRYL  Take 1 tab night before and 1 tab 1 hour prior to cta, and 1 tab 4 hours after cta   Fish Oil 300 MG Caps Take by mouth.   FOLATE PO Take by mouth.   furosemide 20 MG tablet Commonly known as: LASIX TAKE 1 TABLET BY MOUTH EVERY DAY   gabapentin  400 MG capsule Commonly known as: NEURONTIN  TAKE 1 CAPSULE BY MOUTH AT BEDTIME.   Garlic 1000 MG Caps Take by mouth daily.   HYDROcodone -acetaminophen  5-325 MG tablet Commonly known as: NORCO/VICODIN Take 1 tablet by mouth 2 (two) times daily as needed for severe pain (pain score 7-10).   hydrOXYzine  10 MG tablet Commonly known as: ATARAX  Take 10 mg by mouth 3 (three) times daily as needed.   losartan -hydrochlorothiazide 100-25 MG tablet Commonly known as: HYZAAR TAKE 1 TABLET BY MOUTH EVERY DAY   Melatonin 10 MG Tabs Take by mouth.   pantoprazole  40 MG tablet Commonly known as: PROTONIX  TAKE 1 TABLET BY MOUTH EVERY DAY   ticagrelor  90 MG Tabs tablet Commonly known as: Brilinta  Take 1 tablet (90 mg total) by mouth 2 (two) times daily.   tiZANidine  4 MG tablet Commonly known as: ZANAFLEX  TAKE 1 TABLET BY MOUTH FOUR TIMES A DAY   traZODone  100 MG tablet Commonly known as: DESYREL  Take 100 mg by mouth at bedtime.   Ventolin  HFA 108 (90 Base) MCG/ACT inhaler Generic drug: albuterol  Inhale 2 puffs into the lungs every 4 (four) hours as needed for wheezing or shortness of breath.   Zinc 50 MG Tabs Take by mouth.            Assessment & Plan:   Problem List Items Addressed This Visit   None Visit Diagnoses       Dog bite of  left thumb, initial encounter    -  Primary   Relevant Medications   clindamycin (CLEOCIN) 300 MG capsule   ciprofloxacin (CIPRO) 500 MG tablet     Cellulitis of left thumb       Relevant Medications   clindamycin (CLEOCIN) 300 MG capsule   ciprofloxacin (CIPRO) 500 MG tablet      Sending antibiotics for patient.  Will reassess at  follow up.  Patient given precautions to go to the ED.  Return as previously scheduled.  Total time spent: 20 minutes  Trenda Frisk, FNP  07/03/2023   This document may have been prepared by [facility] Voice Recognition software and as such may include unintentional dictation errors.

## 2023-07-11 ENCOUNTER — Ambulatory Visit: Admitting: Family

## 2023-07-18 ENCOUNTER — Encounter: Payer: Self-pay | Admitting: Family

## 2023-07-18 ENCOUNTER — Ambulatory Visit (INDEPENDENT_AMBULATORY_CARE_PROVIDER_SITE_OTHER): Admitting: Family

## 2023-07-18 VITALS — BP 154/76 | HR 95 | Ht 65.0 in | Wt 175.2 lb

## 2023-07-18 DIAGNOSIS — E538 Deficiency of other specified B group vitamins: Secondary | ICD-10-CM | POA: Diagnosis not present

## 2023-07-18 DIAGNOSIS — R5383 Other fatigue: Secondary | ICD-10-CM | POA: Diagnosis not present

## 2023-07-18 DIAGNOSIS — R7303 Prediabetes: Secondary | ICD-10-CM

## 2023-07-18 DIAGNOSIS — M160 Bilateral primary osteoarthritis of hip: Secondary | ICD-10-CM

## 2023-07-18 DIAGNOSIS — E559 Vitamin D deficiency, unspecified: Secondary | ICD-10-CM

## 2023-07-18 DIAGNOSIS — E782 Mixed hyperlipidemia: Secondary | ICD-10-CM | POA: Diagnosis not present

## 2023-07-18 DIAGNOSIS — F411 Generalized anxiety disorder: Secondary | ICD-10-CM | POA: Diagnosis not present

## 2023-07-18 DIAGNOSIS — I10 Essential (primary) hypertension: Secondary | ICD-10-CM

## 2023-07-18 MED ORDER — GABAPENTIN 400 MG PO CAPS: 400.0000 mg | ORAL_CAPSULE | Freq: Every day | ORAL | 1 refills | Status: AC

## 2023-07-18 MED ORDER — HYDROCODONE-ACETAMINOPHEN 5-325 MG PO TABS: 1.0000 | ORAL_TABLET | Freq: Two times a day (BID) | ORAL | 0 refills | Status: DC | PRN

## 2023-07-18 NOTE — Progress Notes (Signed)
 Established Patient Office Visit  Subjective:  Patient ID: [place], female    DOB: 11-22-1950  Age: 73 y.o. MRN: [id number]  Chief Complaint  Patient presents with   Follow-up    3 month follow up    Patient is here today for her 3 months follow up.  She has been feeling well since last appointment.   She does not have additional concerns to discuss today.  Labs are due today. She needs refills.   I have reviewed her active problem list, medication list, allergies, notes from last encounter, lab results for her appointment today.      No other concerns at this time.   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   Depression    GERD (gastroesophageal reflux disease)    Heart murmur    Hyperlipidemia    Hypertension    Hypokalemia 01/30/2022   Hypomagnesemia 01/30/2022   Norovirus 01/30/2022   NSTEMI (non-ST elevated myocardial infarction) (HCC) 03/20/2020   Pharmacologic therapy 11/06/2016   Problems influencing health status 11/06/2016    Past Surgical History:  Procedure Laterality Date   ABDOMINAL HYSTERECTOMY     APPENDECTOMY     CORONARY STENT INTERVENTION N/A 03/20/2020   Procedure: CORONARY STENT INTERVENTION;  Surgeon: Sammy Crisp, MD;  Location: [facility] INVASIVE CV LAB;  Service: Cardiovascular;  Laterality: N/A;   FACIAL COSMETIC SURGERY  2016   LAPAROSCOPY ABDOMEN DIAGNOSTIC  2016   removal of adhesions   LEFT HEART CATH AND CORONARY ANGIOGRAPHY N/A 03/20/2020   Procedure: LEFT HEART CATH AND CORONARY ANGIOGRAPHY with Intervention;  Surgeon: Cherrie Cornwall, MD;  Location: [facility] INVASIVE CV LAB;  Service: Cardiovascular;  Laterality: N/A;   rhinoplasty  2016   tummy tuck  2016   VASCULAR SURGERY      Social History   Socioeconomic History   Marital status: Widowed     Spouse name: Not on file   Number of children: Not on file   Years of education: Not on file   Highest education level: Not on file  Occupational History   Not on file  Tobacco Use   Smoking status: Every Day    Current packs/day: 0.15    Types: Cigarettes   Smokeless tobacco: Never   Tobacco comments:    3-4 cig/day  Substance and Sexual Activity   Alcohol use: No   Drug use: Never   Sexual activity: Not Currently  Other Topics Concern   Not on file  Social History Narrative   ** Merged History Encounter **       Social Drivers of Health   Financial Resource Strain: Not on file  Food Insecurity: No Food Insecurity (01/27/2022)   Hunger Vital Sign    Worried About Running Out of Food in the Last Year: Never true    Ran Out of Food in the Last Year: Never true  Transportation Needs: No Transportation Needs (01/27/2022)   PRAPARE - Administrator, Civil Service (Medical): No    Lack of Transportation (Non-Medical): No  Physical Activity: Not on file  Stress: Not on file  Social Connections: Not on file  Intimate Partner Violence: Not At Risk (01/27/2022)   Humiliation, Afraid, Rape, and Kick questionnaire    Fear of Current or Ex-Partner: No  Emotionally Abused: No    Physically Abused: No    Sexually Abused: No    Family History  Problem Relation Age of Onset   Heart disease Mother    Stroke Mother    Cancer Mother    Heart disease Father    Heart disease Brother    Heart disease Brother     Allergies  Allergen Reactions   Iodinated Contrast Media Anaphylaxis    Swelling and can't breath    Penicillins Shortness Of Breath and Swelling    Patient states "I pass out"  EMS is normally called.    Penicillins Anaphylaxis, Shortness Of Breath and Swelling    Patient states "I pass out"  EMS is normally called.   Shellfish Allergy Anaphylaxis    Iodine fine topically.  Can not use ivp dye    Review of Systems  All other systems reviewed and  are negative.      Objective:   BP (!) 154/76   Pulse 95   Ht 5\' 5"  (1.651 m)   Wt 175 lb 3.2 oz (79.5 kg)   SpO2 97%   BMI 29.15 kg/m   Vitals:   07/18/23 1116  BP: (!) 154/76  Pulse: 95  Height: 5\' 5"  (1.651 m)  Weight: 175 lb 3.2 oz (79.5 kg)  SpO2: 97%  BMI (Calculated): 29.15    Physical Exam Vitals and nursing note reviewed.  Constitutional:      Appearance: Normal appearance. She is normal weight.  HENT:     Head: Normocephalic.  Eyes:     Extraocular Movements: Extraocular movements intact.     Conjunctiva/sclera: Conjunctivae normal.     Pupils: Pupils are equal, round, and reactive to light.  Cardiovascular:     Rate and Rhythm: Normal rate.  Pulmonary:     Effort: Pulmonary effort is normal.  Neurological:     General: No focal deficit present.     Mental Status: She is alert and oriented to person, place, and time. Mental status is at baseline.  Psychiatric:        Mood and Affect: Mood normal.        Behavior: Behavior normal.        Thought Content: Thought content normal.      No results found for any visits on 07/18/23.  No results found for this or any previous visit (from the past 2160 hours).     Assessment & Plan:   Problem List Items Addressed This Visit       Musculoskeletal and Integument   Osteoarthritis of hip (Bilateral) (Chronic)   Patient stable.  Well controlled with current therapy.   Continue current meds.        Relevant Medications   HYDROcodone -acetaminophen  (NORCO/VICODIN) 5-325 MG tablet   Other Relevant Orders   CMP14+EGFR   CBC with Diff     Other   Generalized anxiety disorder (Chronic)   Patient stable.  Well controlled with current therapy.   Continue current meds.        Relevant Medications   buPROPion  (WELLBUTRIN  XL) 300 MG 24 hr tablet   Other Relevant Orders   CMP14+EGFR   CBC with Diff   Mixed hyperlipidemia - Primary   Checking labs today.  Continue current therapy for lipid  control. Will modify as needed based on labwork results.        Relevant Orders   Lipid panel   CMP14+EGFR   CBC with Diff   Other Visit Diagnoses  B12 deficiency due to diet       Checking labs today.  Will continue supplements as needed.   Relevant Orders   CMP14+EGFR   Vitamin B12   CBC with Diff     Vitamin D  deficiency, unspecified       Checking labs today.  Will continue supplements as needed.   Relevant Orders   VITAMIN D  25 Hydroxy (Vit-D Deficiency, Fractures)   CMP14+EGFR   CBC with Diff     Prediabetes       A1C is in prediabetic ranges. Patient counseled on dietary choices and verbalized understanding. Will reassess at follow up after next lab check.   Relevant Orders   CMP14+EGFR   Hemoglobin A1c   CBC with Diff     Other fatigue       Relevant Orders   CMP14+EGFR   TSH   CBC with Diff       Return in about 4 months (around 11/17/2023) for F/U.   Total time spent: 20 minutes  Trenda Frisk, FNP  07/18/2023   This document may have been prepared by [facility] Voice Recognition software and as such may include unintentional dictation errors.

## 2023-07-18 NOTE — Assessment & Plan Note (Signed)
 Patient stable.  Well controlled with current therapy.   Continue current meds.

## 2023-07-18 NOTE — Assessment & Plan Note (Signed)
 Checking labs today.  Continue current therapy for lipid control. Will modify as needed based on labwork results.

## 2023-07-19 LAB — CMP14+EGFR
ALT: 40 IU/L — ABNORMAL HIGH (ref 0–32)
AST: 32 IU/L (ref 0–40)
Albumin: 4.4 g/dL (ref 3.8–4.8)
Alkaline Phosphatase: 70 IU/L (ref 44–121)
BUN/Creatinine Ratio: 11 — ABNORMAL LOW (ref 12–28)
BUN: 10 mg/dL (ref 8–27)
Bilirubin Total: 0.3 mg/dL (ref 0.0–1.2)
CO2: 26 mmol/L (ref 20–29)
Calcium: 9.5 mg/dL (ref 8.7–10.3)
Chloride: 91 mmol/L — ABNORMAL LOW (ref 96–106)
Creatinine, Ser: 0.91 mg/dL (ref 0.57–1.00)
Globulin, Total: 2.2 g/dL (ref 1.5–4.5)
Glucose: 125 mg/dL — ABNORMAL HIGH (ref 70–99)
Potassium: 3.2 mmol/L — ABNORMAL LOW (ref 3.5–5.2)
Sodium: 133 mmol/L — ABNORMAL LOW (ref 134–144)
Total Protein: 6.6 g/dL (ref 6.0–8.5)
eGFR: 67 mL/min/{1.73_m2} (ref >59)

## 2023-07-19 LAB — CBC WITH DIFFERENTIAL/PLATELET
Basophils Absolute: 0 10*3/uL (ref 0.0–0.2)
Basos: 1 %
EOS (ABSOLUTE): 0.1 10*3/uL (ref 0.0–0.4)
Eos: 1 %
Hematocrit: 38.5 % (ref 34.0–46.6)
Hemoglobin: 12.8 g/dL (ref 11.1–15.9)
Immature Grans (Abs): 0 10*3/uL (ref 0.0–0.1)
Immature Granulocytes: 0 %
Lymphocytes Absolute: 2.1 10*3/uL (ref 0.7–3.1)
Lymphs: 28 %
MCH: 30.3 pg (ref 26.6–33.0)
MCHC: 33.2 g/dL (ref 31.5–35.7)
MCV: 91 fL (ref 79–97)
Monocytes Absolute: 0.5 10*3/uL (ref 0.1–0.9)
Monocytes: 6 %
Neutrophils Absolute: 4.9 10*3/uL (ref 1.4–7.0)
Neutrophils: 64 %
Platelets: 247 10*3/uL (ref 150–450)
RBC: 4.23 x10E6/uL (ref 3.77–5.28)
RDW: 12.6 % (ref 11.7–15.4)
WBC: 7.7 10*3/uL (ref 3.4–10.8)

## 2023-07-19 LAB — LIPID PANEL
Chol/HDL Ratio: 2.6 ratio (ref 0.0–4.4)
Cholesterol, Total: 148 mg/dL (ref 100–199)
HDL: 56 mg/dL (ref >39)
LDL Chol Calc (NIH): 55 mg/dL (ref 0–99)
Triglycerides: 236 mg/dL — ABNORMAL HIGH (ref 0–149)
VLDL Cholesterol Cal: 37 mg/dL (ref 5–40)

## 2023-07-19 LAB — VITAMIN D 25 HYDROXY (VIT D DEFICIENCY, FRACTURES): Vit D, 25-Hydroxy: 43.2 ng/mL (ref 30.0–100.0)

## 2023-07-19 LAB — VITAMIN B12: Vitamin B-12: 856 pg/mL (ref 232–1245)

## 2023-07-19 LAB — HEMOGLOBIN A1C
Est. average glucose Bld gHb Est-mCnc: 126 mg/dL
Hgb A1c MFr Bld: 6 % — ABNORMAL HIGH (ref 4.8–5.6)

## 2023-07-19 LAB — TSH: TSH: 1.39 u[IU]/mL (ref 0.450–4.500)

## 2023-07-24 ENCOUNTER — Ambulatory Visit: Payer: Self-pay

## 2023-09-12 ENCOUNTER — Other Ambulatory Visit: Payer: Self-pay

## 2023-09-12 NOTE — Telephone Encounter (Signed)
 Pt called requesting refill on pain rx, please advise

## 2023-09-14 MED ORDER — HYDROCODONE-ACETAMINOPHEN 5-325 MG PO TABS: 1.0000 | ORAL_TABLET | Freq: Two times a day (BID) | ORAL | 0 refills | Status: DC | PRN

## 2023-09-27 ENCOUNTER — Other Ambulatory Visit: Payer: Self-pay | Admitting: Cardiovascular Disease

## 2023-10-21 ENCOUNTER — Other Ambulatory Visit: Payer: Self-pay | Admitting: Family

## 2023-10-21 DIAGNOSIS — E782 Mixed hyperlipidemia: Secondary | ICD-10-CM

## 2023-11-03 ENCOUNTER — Telehealth: Payer: Self-pay | Admitting: Family

## 2023-11-03 NOTE — Telephone Encounter (Signed)
 Pt called in stating she would like a refill of her HY[facility]OCODONE5-[facility] [place]  Pt has 2 pills left State her back, ankles & head have been hurting

## 2023-11-06 ENCOUNTER — Telehealth: Payer: Self-pay | Admitting: Family

## 2023-11-06 ENCOUNTER — Other Ambulatory Visit: Payer: Self-pay

## 2023-11-08 MED ORDER — HYDROCODONE-ACETAMINOPHEN 5-325 MG PO TABS: 1.0000 | ORAL_TABLET | Freq: Two times a day (BID) | ORAL | 0 refills | Status: AC | PRN

## 2023-11-09 NOTE — Telephone Encounter (Signed)
 Rx has been sent

## 2023-11-21 ENCOUNTER — Encounter: Payer: Self-pay | Admitting: Family

## 2023-11-21 ENCOUNTER — Ambulatory Visit (INDEPENDENT_AMBULATORY_CARE_PROVIDER_SITE_OTHER): Admitting: Family

## 2023-11-21 VITALS — BP 160/84 | HR 96 | Ht 65.0 in | Wt 168.6 lb

## 2023-11-21 DIAGNOSIS — E559 Vitamin D deficiency, unspecified: Secondary | ICD-10-CM | POA: Diagnosis not present

## 2023-11-21 DIAGNOSIS — R5383 Other fatigue: Secondary | ICD-10-CM | POA: Diagnosis not present

## 2023-11-21 DIAGNOSIS — R7303 Prediabetes: Secondary | ICD-10-CM

## 2023-11-21 DIAGNOSIS — M160 Bilateral primary osteoarthritis of hip: Secondary | ICD-10-CM | POA: Diagnosis not present

## 2023-11-21 DIAGNOSIS — E538 Deficiency of other specified B group vitamins: Secondary | ICD-10-CM

## 2023-11-21 DIAGNOSIS — Z79899 Other long term (current) drug therapy: Secondary | ICD-10-CM | POA: Diagnosis not present

## 2023-11-21 DIAGNOSIS — R413 Other amnesia: Secondary | ICD-10-CM

## 2023-11-21 DIAGNOSIS — R2681 Unsteadiness on feet: Secondary | ICD-10-CM

## 2023-11-21 DIAGNOSIS — E782 Mixed hyperlipidemia: Secondary | ICD-10-CM

## 2023-11-21 DIAGNOSIS — G894 Chronic pain syndrome: Secondary | ICD-10-CM | POA: Diagnosis not present

## 2023-11-21 DIAGNOSIS — J4489 Other specified chronic obstructive pulmonary disease: Secondary | ICD-10-CM

## 2023-11-21 DIAGNOSIS — N3942 Incontinence without sensory awareness: Secondary | ICD-10-CM

## 2023-11-21 NOTE — Assessment & Plan Note (Signed)
 Checking labs today.  Continue current therapy for lipid control. Will modify as needed based on labwork results.   -CMP w/eGFR -Lipid Panel

## 2023-11-21 NOTE — Progress Notes (Unsigned)
 Established Patient Office Visit  Subjective:  Patient ID: Sabrina Barker, female    DOB: 06/29/1950  Age: 73 y.o. MRN: [id number]  Chief Complaint  Patient presents with   Follow-up    4 month follow up    Patient is here today for her 4 months follow up.  She has been feeling fairly well since last appointment.   She does have additional concerns to discuss today.   She bhas concerns ofnocturnal incontinence requiring protective bedding. Reports changing sheets every other day due to nighttime episodes. Incontinence occurs only during sleep, not during daytime hours. Requests protective pads for bed to manage issue.  Discusses ongoing pain medication management. Currently managed with pain medications taken only at nighttime. Reports not using full monthly allotment, typically calls for refill when one pill remaining. Experiences 2-3 day delays in obtaining refills.  Reports elevated blood pressure today with associated shakiness. Took nerve medication this morning with regular medications but continues experiencing tremors without identifiable anxiety trigger.  Memory concerns reported. Became lost driving to appointment, ended up on wrong side of hospital before reorienting. Reports progressive memory decline. Also having gait instability with recent falls. Multiple falls in past week. Reports difficulty lifting feet adequately when walking, leading to tripping. Attributes falls to personal mobility issues rather than environmental factors.  Labs are due today.  She needs refills.   I have reviewed her active problem list, medication list, allergies, notes from last encounter, lab results for her appointment today.    No other concerns at this time.   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    Depression    GERD (gastroesophageal reflux disease)    Heart murmur    Hyperlipidemia    Hypertension    Hypokalemia 01/30/2022   Hypomagnesemia 01/30/2022   Norovirus 01/30/2022   NSTEMI (non-ST elevated myocardial infarction) (HCC) 03/20/2020   Pharmacologic therapy 11/06/2016   Problems influencing health status 11/06/2016    Past Surgical History:  Procedure Laterality Date   ABDOMINAL HYSTERECTOMY     APPENDECTOMY     CORONARY STENT INTERVENTION N/A 03/20/2020   Procedure: CORONARY STENT INTERVENTION;  Surgeon: Mady Bruckner, MD;  Location: [facility];  Service: Cardiovascular;  Laterality: N/A;   FACIAL COSMETIC SURGERY  2016   LAPAROSCOPY ABDOMEN DIAGNOSTIC  2016   removal of adhesions   LEFT HEART CATH AND CORONARY ANGIOGRAPHY N/A 03/20/2020   Procedure: LEFT HEART CATH AND CORONARY ANGIOGRAPHY with Intervention;  Surgeon: Fernand Denyse LABOR, MD;  Location: [facility];  Service: Cardiovascular;  Laterality: N/A;   rhinoplasty  2016   tummy tuck  2016   VASCULAR SURGERY      Social History   Socioeconomic History   Marital status: Widowed    Spouse name: Not on file   Number of children: Not on file   Years of education: Not on file   Highest education level: Not on file  Occupational History   Not on file  Tobacco Use   Smoking status: Every Day    Current packs/day: 0.15    Types: Cigarettes   Smokeless tobacco: Never   Tobacco comments:    3-4 cig/day  Substance and Sexual Activity   Alcohol use: No   Drug use: Never   Sexual activity: Not  Currently  Other Topics Concern   Not on file  Social History Narrative   ** Merged History Encounter **       Social Drivers of Health   Financial Resource Strain: Not on file  Food Insecurity: No Food Insecurity (01/27/2022)   Hunger Vital Sign    Worried About Running Out of Food in the Last Year: Never true    Ran Out of Food in the Last Year: Never true  Transportation Needs: No  Transportation Needs (01/27/2022)   PRAPARE - Administrator, Civil Service (Medical): No    Lack of Transportation (Non-Medical): No  Physical Activity: Not on file  Stress: Not on file  Social Connections: Not on file  Intimate Partner Violence: Not At Risk (01/27/2022)   Humiliation, Afraid, Rape, and Kick questionnaire    Fear of Current or Ex-Partner: No    Emotionally Abused: No    Physically Abused: No    Sexually Abused: No    Family History  Problem Relation Age of Onset   Heart disease Mother    Stroke Mother    Cancer Mother    Heart disease Father    Heart disease Brother    Heart disease Brother     Allergies  Allergen Reactions   Iodinated Contrast Media Anaphylaxis    Swelling and can't breath    Penicillins Shortness Of Breath and Swelling    Patient states I pass out  EMS is normally called.    Penicillins Anaphylaxis, Shortness Of Breath and Swelling    Patient states I pass out  EMS is normally called.   Shellfish Allergy Anaphylaxis    Iodine fine topically.  Can not use ivp dye    Review of Systems  Constitutional:  Positive for malaise/fatigue.  Musculoskeletal:  Positive for back pain and joint pain.  Neurological:  Positive for dizziness and weakness.  Psychiatric/Behavioral:  Positive for memory loss.   All other systems reviewed and are negative.      Objective:   BP (!) 160/84   Pulse 96   Ht 5' 5 (1.651 m)   Wt 168 lb 9.6 oz (76.5 kg)   SpO2 95%   BMI 28.06 kg/m   Vitals:   11/21/23 1027  BP: (!) 160/84  Pulse: 96  Height: 5' 5 (1.651 m)  Weight: 168 lb 9.6 oz (76.5 kg)  SpO2: 95%  BMI (Calculated): 28.06    Physical Exam Vitals and nursing note reviewed.  Constitutional:      Appearance: Normal appearance. She is normal weight.  HENT:     Head: Normocephalic.  Eyes:     Extraocular Movements: Extraocular movements intact.     Conjunctiva/sclera: Conjunctivae normal.     Pupils: Pupils are equal,  round, and reactive to light.  Cardiovascular:     Rate and Rhythm: Normal rate.  Pulmonary:     Effort: Pulmonary effort is normal.  Neurological:     General: No focal deficit present.     Mental Status: She is alert and oriented to person, place, and time. Mental status is at baseline.  Psychiatric:        Mood and Affect: Mood normal.        Behavior: Behavior normal.        Thought Content: Thought content normal.      No results found for any visits on 11/21/23.  No results found for this or any previous visit (from the past 2160 hours).  Assessment & Plan Other specified chronic obstructive pulmonary disease (HCC) Patient stable.  Well controlled with current therapy.   Continue current meds.   Mixed hyperlipidemia Checking labs today.  Continue current therapy for lipid control. Will modify as needed based on labwork results.   -CMP w/eGFR -Lipid Panel  Vitamin D  deficiency, unspecified B12 deficiency due to diet Other fatigue Checking labs today.  Will continue supplements as needed.   - Vitamin D  - Vitamin B12 - TSH  Prediabetes A1C Continues to be in prediabetic ranges.  Will reassess at follow up after next lab check.  Patient counseled on dietary choices and verbalized understanding.   -CBC w/Diff -CMP w/eGFR -Hemoglobin A1C  Memory loss Gait instability Setting patient up for referral to neurology .  Will defer to them for further treatment changes.  Reassess at follow up.  Chronic pain syndrome Long term current use of therapeutic drug UDS obtained today in office.  Will call with results when available.   Patient stable.  Well controlled with current therapy.   Continue current meds.  - Will send short term fill, setting pt up for referral to pain mgmt.  Urinary incontinence without sensory awareness Will work to get patient incontinence supplies due to her nocturnal urinary incontinence.     Return in about 4 months  (around 03/23/2024).   Total time spent: 30 minutes  ALAN CHRISTELLA ARRANT, FNP  11/21/2023   This document may have been prepared by [facility] Voice Recognition software and as such may include unintentional dictation errors.

## 2023-11-21 NOTE — Assessment & Plan Note (Signed)
 UDS obtained today in office.  Will call with results when available.   Patient stable.  Well controlled with current therapy.   Continue current meds.

## 2023-11-22 LAB — LIPID PANEL
Chol/HDL Ratio: 2.4 ratio (ref 0.0–4.4)
Cholesterol, Total: 134 mg/dL (ref 100–199)
HDL: 55 mg/dL (ref >39)
LDL Chol Calc (NIH): 52 mg/dL (ref 0–99)
Triglycerides: 163 mg/dL — ABNORMAL HIGH (ref 0–149)
VLDL Cholesterol Cal: 27 mg/dL (ref 5–40)

## 2023-11-22 LAB — CBC WITH DIFFERENTIAL/PLATELET
Basophils Absolute: 0.1 x10E3/uL (ref 0.0–0.2)
Basos: 1 %
EOS (ABSOLUTE): 0.1 x10E3/uL (ref 0.0–0.4)
Eos: 1 %
Hematocrit: 42.2 % (ref 34.0–46.6)
Hemoglobin: 14 g/dL (ref 11.1–15.9)
Immature Grans (Abs): 0 x10E3/uL (ref 0.0–0.1)
Immature Granulocytes: 0 %
Lymphocytes Absolute: 1.9 x10E3/uL (ref 0.7–3.1)
Lymphs: 23 %
MCH: 29.4 pg (ref 26.6–33.0)
MCHC: 33.2 g/dL (ref 31.5–35.7)
MCV: 89 fL (ref 79–97)
Monocytes Absolute: 0.5 x10E3/uL (ref 0.1–0.9)
Monocytes: 5 %
Neutrophils Absolute: 5.9 x10E3/uL (ref 1.4–7.0)
Neutrophils: 70 %
Platelets: 229 x10E3/uL (ref 150–450)
RBC: 4.76 x10E6/uL (ref 3.77–5.28)
RDW: 12.7 % (ref 11.7–15.4)
WBC: 8.4 x10E3/uL (ref 3.4–10.8)

## 2023-11-22 LAB — HEMOGLOBIN A1C
Est. average glucose Bld gHb Est-mCnc: 117 mg/dL
Hgb A1c MFr Bld: 5.7 % — ABNORMAL HIGH (ref 4.8–5.6)

## 2023-11-22 LAB — VITAMIN D 25 HYDROXY (VIT D DEFICIENCY, FRACTURES): Vit D, 25-Hydroxy: 44.1 ng/mL (ref 30.0–100.0)

## 2023-11-22 LAB — TSH: TSH: 1.49 u[IU]/mL (ref 0.450–4.500)

## 2023-11-22 LAB — VITAMIN B12: Vitamin B-12: >2000 pg/mL — ABNORMAL HIGH (ref 232–1245)

## 2023-11-22 LAB — CMP14+EGFR
ALT: 44 IU/L — ABNORMAL HIGH (ref 0–32)
AST: 31 IU/L (ref 0–40)
Albumin: 4.8 g/dL (ref 3.8–4.8)
Alkaline Phosphatase: 70 IU/L (ref 49–135)
BUN/Creatinine Ratio: 10 — ABNORMAL LOW (ref 12–28)
BUN: 10 mg/dL (ref 8–27)
Bilirubin Total: 0.5 mg/dL (ref 0.0–1.2)
CO2: 28 mmol/L (ref 20–29)
Calcium: 11.1 mg/dL — ABNORMAL HIGH (ref 8.7–10.3)
Chloride: 90 mmol/L — ABNORMAL LOW (ref 96–106)
Creatinine, Ser: 1.01 mg/dL — ABNORMAL HIGH (ref 0.57–1.00)
Globulin, Total: 2.4 g/dL (ref 1.5–4.5)
Glucose: 87 mg/dL (ref 70–99)
Potassium: 3.6 mmol/L (ref 3.5–5.2)
Sodium: 135 mmol/L (ref 134–144)
Total Protein: 7.2 g/dL (ref 6.0–8.5)
eGFR: 59 mL/min/1.73 — ABNORMAL LOW (ref >59)

## 2023-11-22 MED ORDER — DISPOSABLE UNDERPADS 30"X36" MISC: 1.0000 | Freq: Two times a day (BID) | 11 refills | Status: AC

## 2023-11-24 LAB — TOXASSURE SELECT 13 (MW), URINE

## 2023-11-30 DIAGNOSIS — G44209 Tension-type headache, unspecified, not intractable: Secondary | ICD-10-CM | POA: Diagnosis not present

## 2023-11-30 DIAGNOSIS — R030 Elevated blood-pressure reading, without diagnosis of hypertension: Secondary | ICD-10-CM | POA: Diagnosis not present

## 2023-11-30 DIAGNOSIS — M25552 Pain in left hip: Secondary | ICD-10-CM | POA: Diagnosis not present

## 2023-11-30 DIAGNOSIS — Z1159 Encounter for screening for other viral diseases: Secondary | ICD-10-CM | POA: Diagnosis not present

## 2023-11-30 DIAGNOSIS — Z87891 Personal history of nicotine dependence: Secondary | ICD-10-CM | POA: Diagnosis not present

## 2023-11-30 DIAGNOSIS — Z87898 Personal history of other specified conditions: Secondary | ICD-10-CM | POA: Diagnosis not present

## 2023-11-30 DIAGNOSIS — F17210 Nicotine dependence, cigarettes, uncomplicated: Secondary | ICD-10-CM | POA: Diagnosis not present

## 2023-11-30 DIAGNOSIS — M549 Dorsalgia, unspecified: Secondary | ICD-10-CM | POA: Diagnosis not present

## 2023-11-30 DIAGNOSIS — M542 Cervicalgia: Secondary | ICD-10-CM | POA: Diagnosis not present

## 2023-11-30 DIAGNOSIS — E559 Vitamin D deficiency, unspecified: Secondary | ICD-10-CM | POA: Diagnosis not present

## 2023-11-30 DIAGNOSIS — R5383 Other fatigue: Secondary | ICD-10-CM | POA: Diagnosis not present

## 2023-11-30 DIAGNOSIS — Z79899 Other long term (current) drug therapy: Secondary | ICD-10-CM | POA: Diagnosis not present

## 2023-11-30 DIAGNOSIS — M25551 Pain in right hip: Secondary | ICD-10-CM | POA: Diagnosis not present

## 2023-11-30 DIAGNOSIS — I10 Essential (primary) hypertension: Secondary | ICD-10-CM | POA: Diagnosis not present

## 2023-12-07 DIAGNOSIS — G8929 Other chronic pain: Secondary | ICD-10-CM | POA: Diagnosis not present

## 2023-12-07 DIAGNOSIS — G44209 Tension-type headache, unspecified, not intractable: Secondary | ICD-10-CM | POA: Diagnosis not present

## 2023-12-07 DIAGNOSIS — F17210 Nicotine dependence, cigarettes, uncomplicated: Secondary | ICD-10-CM | POA: Diagnosis not present

## 2023-12-07 DIAGNOSIS — Z79899 Other long term (current) drug therapy: Secondary | ICD-10-CM | POA: Diagnosis not present

## 2023-12-07 DIAGNOSIS — E559 Vitamin D deficiency, unspecified: Secondary | ICD-10-CM | POA: Diagnosis not present

## 2023-12-07 DIAGNOSIS — I10 Essential (primary) hypertension: Secondary | ICD-10-CM | POA: Diagnosis not present

## 2023-12-07 DIAGNOSIS — Z87898 Personal history of other specified conditions: Secondary | ICD-10-CM | POA: Diagnosis not present

## 2023-12-07 DIAGNOSIS — R7401 Elevation of levels of liver transaminase levels: Secondary | ICD-10-CM | POA: Diagnosis not present

## 2023-12-07 DIAGNOSIS — M542 Cervicalgia: Secondary | ICD-10-CM | POA: Diagnosis not present

## 2023-12-07 DIAGNOSIS — I6529 Occlusion and stenosis of unspecified carotid artery: Secondary | ICD-10-CM | POA: Diagnosis not present

## 2023-12-07 DIAGNOSIS — Z87891 Personal history of nicotine dependence: Secondary | ICD-10-CM | POA: Diagnosis not present

## 2023-12-28 ENCOUNTER — Other Ambulatory Visit: Payer: Self-pay | Admitting: Family

## 2023-12-28 DIAGNOSIS — S61052A Open bite of left thumb without damage to nail, initial encounter: Secondary | ICD-10-CM

## 2023-12-28 DIAGNOSIS — L03012 Cellulitis of left finger: Secondary | ICD-10-CM

## 2024-01-03 NOTE — Telephone Encounter (Signed)
 Entered in error

## 2024-01-24 ENCOUNTER — Other Ambulatory Visit: Payer: Self-pay | Admitting: Family

## 2024-01-24 DIAGNOSIS — E782 Mixed hyperlipidemia: Secondary | ICD-10-CM

## 2024-02-06 NOTE — Progress Notes (Signed)
 Sabrina Barker                                          MRN: [id number]   02/06/2024   The [facility] Quality Team Specialist reviewed this patient medical record for the purposes of chart review for care gap closure. The following were reviewed: chart review for care gap closure-breast cancer screening.    [facility] Quality Team

## 2024-02-20 ENCOUNTER — Other Ambulatory Visit: Payer: Self-pay | Admitting: Family

## 2024-03-25 ENCOUNTER — Ambulatory Visit: Admitting: Family
